# Patient Record
Sex: Female | Born: 1970 | Race: White | Hispanic: No | Marital: Married | Smoking: Never smoker
Health system: Southern US, Community
[De-identification: ages and names within clinical notes are randomized; demographics above are authoritative.]

## PROBLEM LIST (undated history)

## (undated) DIAGNOSIS — T7840XA Allergy, unspecified, initial encounter: Secondary | ICD-10-CM

## (undated) HISTORY — PX: VEIN LIGATION: SHX2652

## (undated) HISTORY — DX: Allergy, unspecified, initial encounter: T78.40XA

---

## 2010-11-05 ENCOUNTER — Emergency Department (HOSPITAL_COMMUNITY)
Admission: EM | Admit: 2010-11-05 | Discharge: 2010-11-05 | Payer: Self-pay | Source: Home / Self Care | Admitting: Family Medicine

## 2010-11-13 LAB — POCT I-STAT, CHEM 8
BUN: 9 mg/dL (ref 6–23)
Calcium, Ion: 1.12 mmol/L (ref 1.12–1.32)
Chloride: 106 mEq/L (ref 96–112)
Creatinine, Ser: 0.9 mg/dL (ref 0.4–1.2)
Glucose, Bld: 91 mg/dL (ref 70–99)
HCT: 46 % (ref 36.0–46.0)
Hemoglobin: 15.6 g/dL — ABNORMAL HIGH (ref 12.0–15.0)
Potassium: 4.3 mEq/L (ref 3.5–5.1)
Sodium: 139 mEq/L (ref 135–145)
TCO2: 28 mmol/L (ref 0–100)

## 2020-01-07 ENCOUNTER — Ambulatory Visit: Payer: Self-pay | Attending: Internal Medicine

## 2020-01-07 DIAGNOSIS — Z23 Encounter for immunization: Secondary | ICD-10-CM

## 2020-01-07 NOTE — Progress Notes (Signed)
   Covid-19 Vaccination Clinic  Name:  Carolyn Goodwin    MRN: [id number] DOB: 1971/02/09  01/07/2020  Carolyn Goodwin was observed post Covid-19 immunization for 15 minutes without incident. She was provided with Vaccine Information Sheet and instruction to access the V-Safe system.   Carolyn Goodwin was instructed to call 911 with any severe reactions post vaccine: Marland Kitchen Difficulty breathing  . Swelling of face and throat  . A fast heartbeat  . A bad rash all over body  . Dizziness and weakness   Immunizations Administered    Name Date Dose VIS Date Route   Moderna COVID-19 Vaccine 01/07/2020 11:06 AM 0.5 mL 09/29/2019 Intramuscular   Manufacturer: Moderna   Lot: [id number]   NDC: 53748-270-78

## 2020-02-09 ENCOUNTER — Ambulatory Visit: Payer: Self-pay | Attending: Family

## 2020-02-09 DIAGNOSIS — Z23 Encounter for immunization: Secondary | ICD-10-CM

## 2020-02-09 NOTE — Progress Notes (Signed)
   Covid-19 Vaccination Clinic  Name:  Carolyn Goodwin    MRN: [id number] DOB: [birth year]  02/09/2020  Carolyn Goodwin was observed post Covid-19 immunization for 30 minutes based on pre-vaccination screening without incident. She was provided with Vaccine Information Sheet and instruction to access the V-Safe system.   Carolyn Goodwin was instructed to call 911 with any severe reactions post vaccine: Marland Kitchen Difficulty breathing  . Swelling of face and throat  . A fast heartbeat  . A bad rash all over body  . Dizziness and weakness   Immunizations Administered    Name Date Dose VIS Date Route   Moderna COVID-19 Vaccine 02/09/2020  2:13 PM 0.5 mL 09/29/2019 Intramuscular   Manufacturer: Moderna   Lot: [id number]   NDC: 32009-417-91

## 2020-06-17 ENCOUNTER — Other Ambulatory Visit: Payer: Self-pay | Admitting: Obstetrics

## 2020-06-17 DIAGNOSIS — N63 Unspecified lump in unspecified breast: Secondary | ICD-10-CM

## 2020-07-06 ENCOUNTER — Other Ambulatory Visit: Payer: Self-pay | Admitting: Obstetrics

## 2020-07-06 ENCOUNTER — Ambulatory Visit
Admission: RE | Admit: 2020-07-06 | Discharge: 2020-07-06 | Disposition: A | Discharge status: Home / Self Care | Payer: BC Managed Care – PPO | Source: Ambulatory Visit | Attending: Obstetrics | Admitting: Obstetrics

## 2020-07-06 ENCOUNTER — Other Ambulatory Visit: Payer: Self-pay

## 2020-07-06 DIAGNOSIS — N6320 Unspecified lump in the left breast, unspecified quadrant: Secondary | ICD-10-CM

## 2020-07-06 DIAGNOSIS — N63 Unspecified lump in unspecified breast: Secondary | ICD-10-CM

## 2020-09-27 ENCOUNTER — Ambulatory Visit: Payer: BC Managed Care – PPO | Attending: Internal Medicine

## 2020-09-27 DIAGNOSIS — Z23 Encounter for immunization: Secondary | ICD-10-CM

## 2020-09-27 NOTE — Progress Notes (Signed)
   Covid-19 Vaccination Clinic  Name:  Carolyn Goodwin    MRN: [id number] DOB: Apr 20, 1971  09/27/2020  [name] was observed post Covid-19 immunization for 15 minutes without incident. She was provided with Vaccine Information Sheet and instruction to access the V-Safe system.   [name] was instructed to call 911 with any severe reactions post vaccine: Marland Kitchen Difficulty breathing  . Swelling of face and throat  . A fast heartbeat  . A bad rash all over body  . Dizziness and weakness   Immunizations Administered    No immunizations on file.

## 2021-01-04 ENCOUNTER — Other Ambulatory Visit: Payer: BC Managed Care – PPO

## 2021-01-16 ENCOUNTER — Ambulatory Visit
Admission: RE | Admit: 2021-01-16 | Discharge: 2021-01-16 | Disposition: A | Discharge status: Home / Self Care | Payer: BC Managed Care – PPO | Source: Ambulatory Visit | Attending: Obstetrics | Admitting: Obstetrics

## 2021-01-16 ENCOUNTER — Other Ambulatory Visit: Payer: Self-pay

## 2021-01-16 ENCOUNTER — Other Ambulatory Visit: Payer: Self-pay | Admitting: Obstetrics

## 2021-01-16 DIAGNOSIS — N6320 Unspecified lump in the left breast, unspecified quadrant: Secondary | ICD-10-CM

## 2021-07-14 ENCOUNTER — Other Ambulatory Visit: Payer: Self-pay

## 2021-07-14 ENCOUNTER — Ambulatory Visit
Admission: RE | Admit: 2021-07-14 | Discharge: 2021-07-14 | Disposition: A | Discharge status: Home / Self Care | Payer: BC Managed Care – PPO | Source: Ambulatory Visit | Attending: Obstetrics | Admitting: Obstetrics

## 2021-07-14 DIAGNOSIS — N6320 Unspecified lump in the left breast, unspecified quadrant: Secondary | ICD-10-CM

## 2021-07-21 ENCOUNTER — Other Ambulatory Visit: Payer: Self-pay

## 2021-09-08 ENCOUNTER — Ambulatory Visit: Payer: BC Managed Care – PPO | Attending: Family

## 2021-09-08 DIAGNOSIS — Z23 Encounter for immunization: Secondary | ICD-10-CM

## 2021-09-11 NOTE — Progress Notes (Signed)
   Covid-19 Vaccination Clinic  Name:  Venora Kautzman    MRN: [id number] DOB: June 17, 1971  09/11/2021  Ms. Dorce was observed post Covid-19 immunization for 15 minutes without incident. She was provided with Vaccine Information Sheet and instruction to access the V-Safe system.   Ms. Lodato was instructed to call 911 with any severe reactions post vaccine: Difficulty breathing  Swelling of face and throat  A fast heartbeat  A bad rash all over body  Dizziness and weakness

## 2022-09-19 ENCOUNTER — Encounter: Payer: Self-pay | Admitting: Obstetrics

## 2022-09-19 DIAGNOSIS — Z1231 Encounter for screening mammogram for malignant neoplasm of breast: Secondary | ICD-10-CM

## 2023-04-01 ENCOUNTER — Ambulatory Visit: Payer: BC Managed Care – PPO | Admitting: Family Medicine

## 2023-04-01 ENCOUNTER — Encounter: Payer: Self-pay | Admitting: Family Medicine

## 2023-04-01 VITALS — BP 138/88 | HR 86 | Temp 98.6°F | Resp 16 | Ht 61.0 in | Wt 114.4 lb

## 2023-04-01 DIAGNOSIS — Z1211 Encounter for screening for malignant neoplasm of colon: Secondary | ICD-10-CM | POA: Diagnosis not present

## 2023-04-01 DIAGNOSIS — N979 Female infertility, unspecified: Secondary | ICD-10-CM | POA: Diagnosis not present

## 2023-04-01 DIAGNOSIS — Z0000 Encounter for general adult medical examination without abnormal findings: Secondary | ICD-10-CM

## 2023-04-01 DIAGNOSIS — Z1159 Encounter for screening for other viral diseases: Secondary | ICD-10-CM | POA: Diagnosis not present

## 2023-04-01 DIAGNOSIS — Z1212 Encounter for screening for malignant neoplasm of rectum: Secondary | ICD-10-CM

## 2023-04-01 NOTE — Progress Notes (Signed)
New Patient Office Visit  Subjective:  Patient ID: Carolyn Goodwin, female    DOB: 1971-07-30  Age: 52 y.o. MRN: [id number]  CC:  Chief Complaint  Patient presents with   New Patient (Initial Visit)    Previously seen at [facility], has not been seen in several years    Infertility    Was being seen in a clinic that randomly closed Pregnant last Fall but miscarried    Insect Bite    Left thigh Water blister and red rash     HPI Carolyn Goodwin presents for new patient from [place].  From [place]  Infertility-clinic closed last year(s)  was pregnant in Fall and embryo quit developing.  [facility] [place].  Has to restart process.  Needs form signed that healthy.  Saw OB last week(s).  Talking w/[facility] fertility clinic in [place]. Getting mamm tomorrow.   Got labs for vitamin D, prolactin, DHEA.  Menses regular since march but shorter. Active-walks, gardens.  Trying to get pregnant since 2018.  Insect bite left thigh-"water blister"  red rash.  Was working in yard and felt stinging left leg.  Several spots-thigh.  Wasn't able to see culprit.  Pulled pants off.    Current Outpatient Medications:    Bromocriptine Mesylate (CYCLOSET) 0.8 MG TABS, Take 0.4 mg by mouth daily., Disp: , Rfl:   Past Medical History:  Diagnosis Date   Allergy     Past Surgical History:  Procedure Laterality Date   VEIN LIGATION Bilateral     Family History  Problem Relation Age of Onset   Hypertension Mother    Depression Mother    Dementia Mother    Asthma Father    Varicose Veins Father    Cancer Maternal Grandmother 68       panc   Breast cancer Other        53's great maternal aunt    Social History   Socioeconomic History   Marital status: Married    Spouse name: Not on file   Number of children: 0   Years of education: Not on file   Highest education level: Not on file  Occupational History   Occupation: professor    Comment: [employer]   Tobacco Use   Smoking status: Never   Smokeless tobacco: Never  Vaping Use   Vaping Use: Never used  Substance and Sexual Activity   Alcohol use: Not Currently   Drug use: Never   Sexual activity: Yes    Birth control/protection: None  Other Topics Concern   Not on file  Social History Narrative   Step-4   Social Determinants of Health   Financial Resource Strain: Not on file  Food Insecurity: Not on file  Transportation Needs: Not on file  Physical Activity: Not on file  Stress: Not on file  Social Connections: Not on file  Intimate Partner Violence: Not on file    ROS  ROS: Gen: no fever, chills  Skin: HPI ENT: no ear pain, ear drainage, nasal congestion, rhinorrhea, sinus pressure, sore throat.  Some allergies Eyes: no blurry vision, double vision Resp: no cough, wheeze,SOB CV: no CP, palpitations, LE edema,  uses herbs for circulation  GI: no heartburn, n/v/d/c, abd pain GU: no dysuria, urgency, frequency, hematuria MSK: no joint pain, myalgias, back pain Neuro: no dizziness, headache, weakness, vertigo Psych: no depression, anxiety, insomnia, SI   Objective:   Today's Vitals: BP 138/88   Pulse 86  Temp 98.6 F (37 C) (Temporal)   Resp 16   Ht 5\' 1"  (1.549 m)   Wt 114 lb 6.4 oz (51.9 kg)   SpO2 98%   BMI 21.62 kg/m   Physical Exam  Gen: WDWN NAD HEENT: NCAT, conjunctiva not injected, sclera nonicteric TM WNL B, OP moist, no exudates  NECK:  supple, no thyromegaly, no nodes, no carotid bruits CARDIAC: RRR, S1S2+, no murmur. DP 2+B LUNGS: CTAB. No wheezes ABDOMEN:  BS+, soft, NTND, No HSM, no masses EXT:  no edema MSK: no gross abnormalities.  NEURO: A&O x3.  CN II-XII intact.  PSYCH: normal mood. Good eye contact  Left thigh-near knee medial side-red area but scratched.  Later thigh-2 slisters(few mm) w/surrounding erythema.  EKG-NSR, no ST changes.  Normal tracing  11/20/22 TSH 1.68  Assessment & Plan:  Wellness examination -     EKG  12-Lead -     Lipid panel -     Comprehensive metabolic panel -     CBC with Differential/Platelet -     Hemoglobin A1c -     TSH  Screening for viral disease -     Hepatitis C antibody -     HIV Antibody (routine testing w rflx) -     Hepatitis B surface antigen -     Measles/Mumps/Rubella Immunity -     Hepatitis B core antibody, total  Infertility, female, primary -     EKG 12-Lead -     Hepatitis C antibody -     HIV Antibody (routine testing w rflx) -     Hepatitis B surface antigen -     Measles/Mumps/Rubella Immunity -     Hepatitis B core antibody, total  Screening for colorectal cancer -     Cologuard  Wellness-anticipatory guidance.  Work on Diet/Exercise  Check CBC,CMP,lipids,TSH, A1C.  F/u 1 yr   signed form that patient is healthy.  Pap UpToDate, getting mamm tomorrow.  Ordered cologard.   Infertility-seeing clinic in [place] and following w/OB/gynecology.  Check MMR titers, etc.   Bug bites left thigh-patient felt it happen.  Blister.  Improved per patient.  Topical treatment(s).  Monitor for now.  Told patient I have no idea what did this.    Follow-up: Return in about 1 year (around 03/31/2024) for annual physical.   Angelena Sole, MD

## 2023-04-01 NOTE — Patient Instructions (Signed)
Welcome to [facility] [place]! It was a pleasure meeting you today. ° °As discussed, Please schedule a 12 month follow up visit today. ° °PLEASE NOTE: ° °If you had any LAB tests please let us know if you have not heard back within a few days. You may see your results on MyChart before we have a chance to review them but we will give you a call once they are reviewed by us. If we ordered any REFERRALS today, please let us know if you have not heard from their office within the next week.  °Let us know through MyChart if you are needing REFILLS, or have your pharmacy send us the request. You can also use MyChart to communicate with me or any office staff. ° °Please try these tips to maintain a healthy lifestyle: ° °Eat most of your calories during the day when you are active. Eliminate processed foods including packaged sweets (pies, cakes, cookies), reduce intake of potatoes, white bread, white pasta, and white rice. Look for whole grain options, oat flour or almond flour. ° °Each meal should contain half fruits/vegetables, one quarter protein, and one quarter carbs (no bigger than a computer mouse). ° °Cut down on sweet beverages. This includes juice, soda, and sweet tea. Also watch fruit intake, though this is a healthier sweet option, it still contains natural sugar! Limit to 3 servings daily. ° °Drink at least 1 glass of water with each meal and aim for at least 8 glasses per day ° °Exercise at least 150 minutes every week.   °

## 2023-04-02 LAB — CBC WITH DIFFERENTIAL/PLATELET
Basophils Absolute: 0.2 10*3/uL — ABNORMAL HIGH (ref 0.0–0.1)
Basophils Relative: 1.7 % (ref 0.0–3.0)
Eosinophils Absolute: 0.3 10*3/uL (ref 0.0–0.7)
Eosinophils Relative: 2.8 % (ref 0.0–5.0)
HCT: 39 % (ref 36.0–46.0)
Hemoglobin: 12.9 g/dL (ref 12.0–15.0)
Lymphocytes Relative: 14.2 % (ref 12.0–46.0)
Lymphs Abs: 1.4 10*3/uL (ref 0.7–4.0)
MCHC: 33.1 g/dL (ref 30.0–36.0)
MCV: 97.3 fl (ref 78.0–100.0)
Monocytes Absolute: 0.7 10*3/uL (ref 0.1–1.0)
Monocytes Relative: 7.4 % (ref 3.0–12.0)
Neutro Abs: 7 10*3/uL (ref 1.4–7.7)
Neutrophils Relative %: 73.9 % (ref 43.0–77.0)
Platelets: 441 10*3/uL — ABNORMAL HIGH (ref 150.0–400.0)
RBC: 4 Mil/uL (ref 3.87–5.11)
RDW: 13.2 % (ref 11.5–15.5)
WBC: 9.5 10*3/uL (ref 4.0–10.5)

## 2023-04-02 LAB — LIPID PANEL
Cholesterol: 233 mg/dL — ABNORMAL HIGH (ref 0–200)
HDL: 77.6 mg/dL (ref >39.00)
LDL Cholesterol: 130 mg/dL — ABNORMAL HIGH (ref 0–99)
NonHDL: 155.79
Total CHOL/HDL Ratio: 3
Triglycerides: 130 mg/dL (ref 0.0–149.0)
VLDL: 26 mg/dL (ref 0.0–40.0)

## 2023-04-02 LAB — COMPREHENSIVE METABOLIC PANEL
ALT: 14 U/L (ref 0–35)
AST: 16 U/L (ref 0–37)
Albumin: 4.6 g/dL (ref 3.5–5.2)
Alkaline Phosphatase: 51 U/L (ref 39–117)
BUN: 13 mg/dL (ref 6–23)
CO2: 32 mEq/L (ref 19–32)
Calcium: 9.6 mg/dL (ref 8.4–10.5)
Chloride: 101 mEq/L (ref 96–112)
Creatinine, Ser: 0.76 mg/dL (ref 0.40–1.20)
GFR: 91.31 mL/min (ref >60.00)
Glucose, Bld: 80 mg/dL (ref 70–99)
Potassium: 3.8 mEq/L (ref 3.5–5.1)
Sodium: 140 mEq/L (ref 135–145)
Total Bilirubin: 0.3 mg/dL (ref 0.2–1.2)
Total Protein: 7.2 g/dL (ref 6.0–8.3)

## 2023-04-02 LAB — HEPATITIS B SURFACE ANTIGEN: Hepatitis B Surface Ag: NONREACTIVE

## 2023-04-02 LAB — HEMOGLOBIN A1C: Hgb A1c MFr Bld: 5.2 % (ref 4.6–6.5)

## 2023-04-02 LAB — MEASLES/MUMPS/RUBELLA IMMUNITY
Mumps IgG: 78.3 AU/mL
Rubella: 21.3 Index
Rubeola IgG: 175 AU/mL

## 2023-04-02 LAB — HEPATITIS B CORE ANTIBODY, TOTAL: Hep B Core Total Ab: NONREACTIVE

## 2023-04-02 LAB — HEPATITIS C ANTIBODY: Hepatitis C Ab: NONREACTIVE

## 2023-04-02 LAB — TSH: TSH: 1.15 u[IU]/mL (ref 0.35–5.50)

## 2023-04-02 LAB — HIV ANTIBODY (ROUTINE TESTING W REFLEX): HIV 1&2 Ab, 4th Generation: NONREACTIVE

## 2023-04-02 NOTE — Progress Notes (Signed)
Labs look good.  Immune to measles, mumps, rubella

## 2023-04-11 ENCOUNTER — Encounter: Payer: Self-pay | Admitting: Family Medicine

## 2023-04-15 LAB — COLOGUARD: COLOGUARD: NEGATIVE

## 2023-09-10 IMAGING — MG DIGITAL DIAGNOSTIC BILAT W/ TOMO W/ CAD
8 series · 8 of 24 positions shown · non-contrast
Comparison: Previous exam(s).

CLINICAL DATA: 49-year-old female for 1 year follow-up of LEFT
breast mass and for annual bilateral mammogram.

EXAM:
DIGITAL DIAGNOSTIC BILATERAL MAMMOGRAM WITH TOMOSYNTHESIS AND CAD;
ULTRASOUND LEFT BREAST LIMITED
TECHNIQUE: Bilateral digital diagnostic mammography and breast tomosynthesis
was performed. The images were evaluated with computer-aided
detection.; Targeted ultrasound examination of the left breast was
performed.

[R MLO synth-2D]
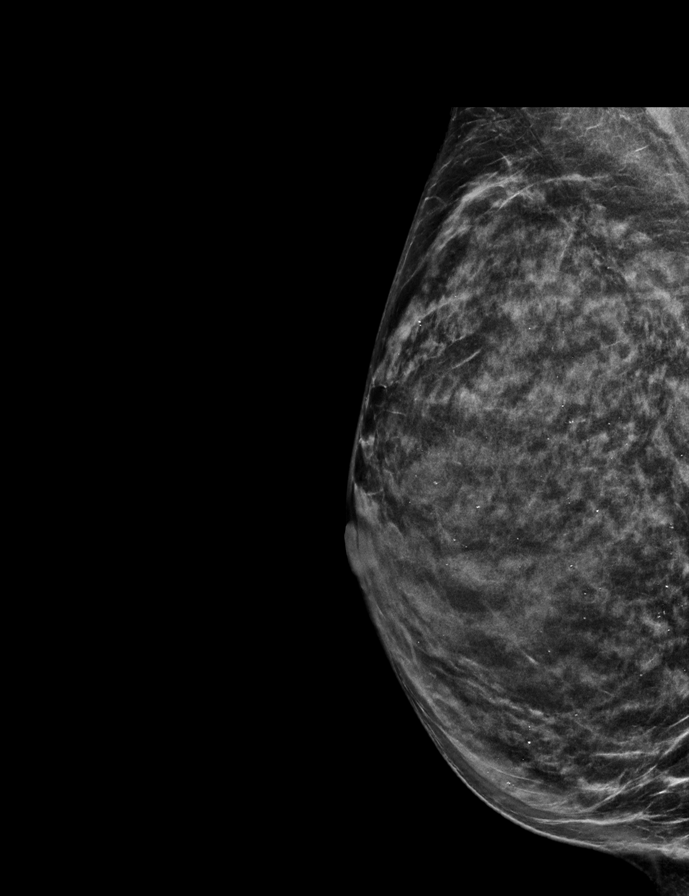

[L CC synth-2D]
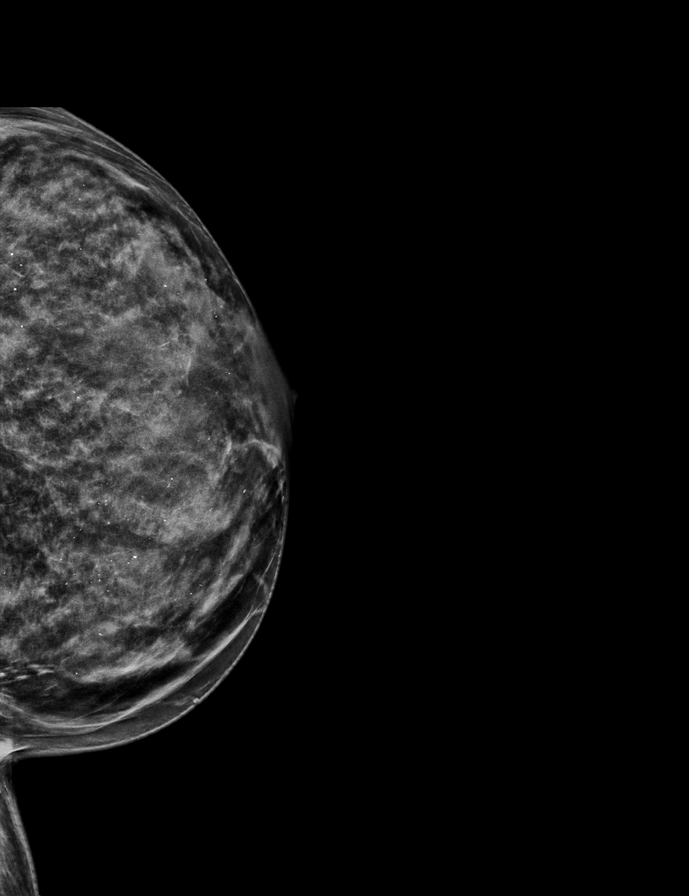

[R CC synth-2D]
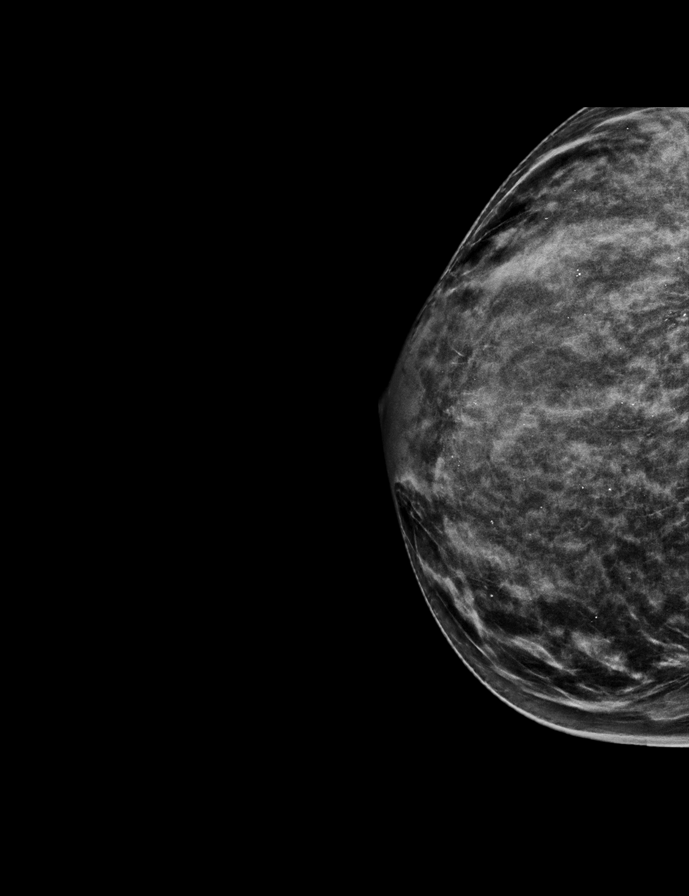

[L MLO synth-2D]
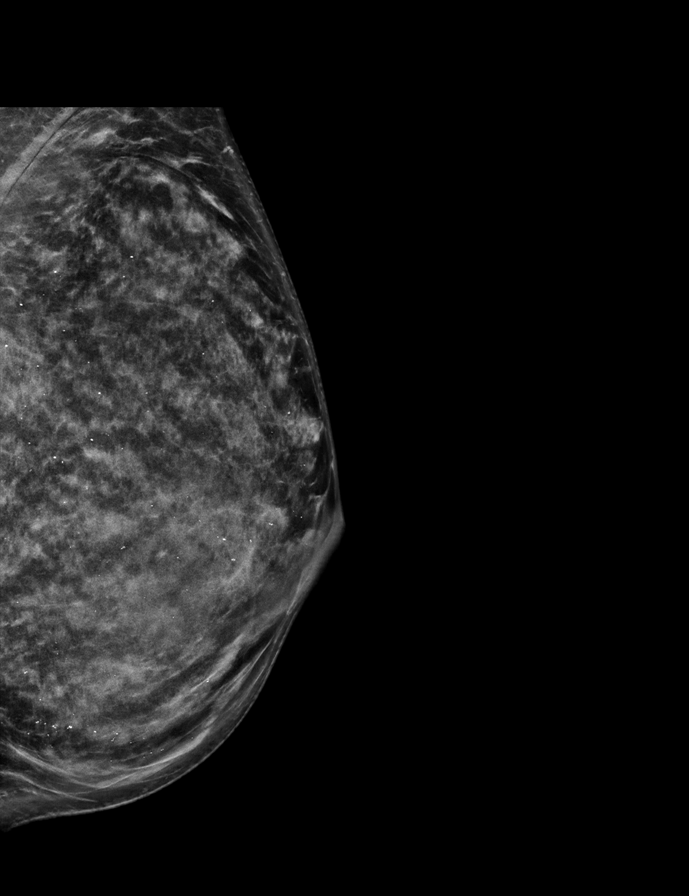

[L CC tomo · tomo slice 27/54.0]
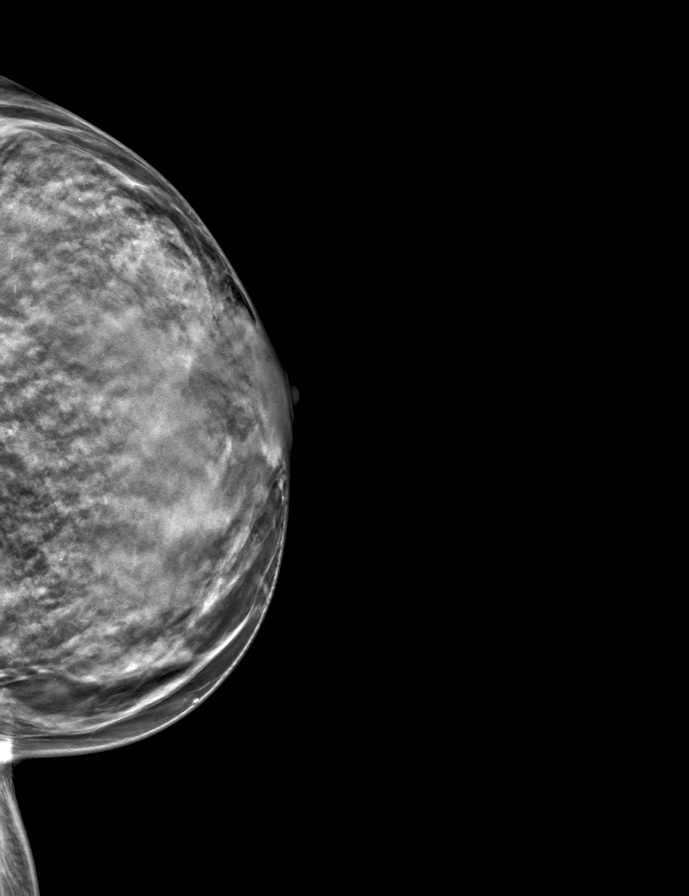

[R MLO tomo · tomo slice 29/57.0]
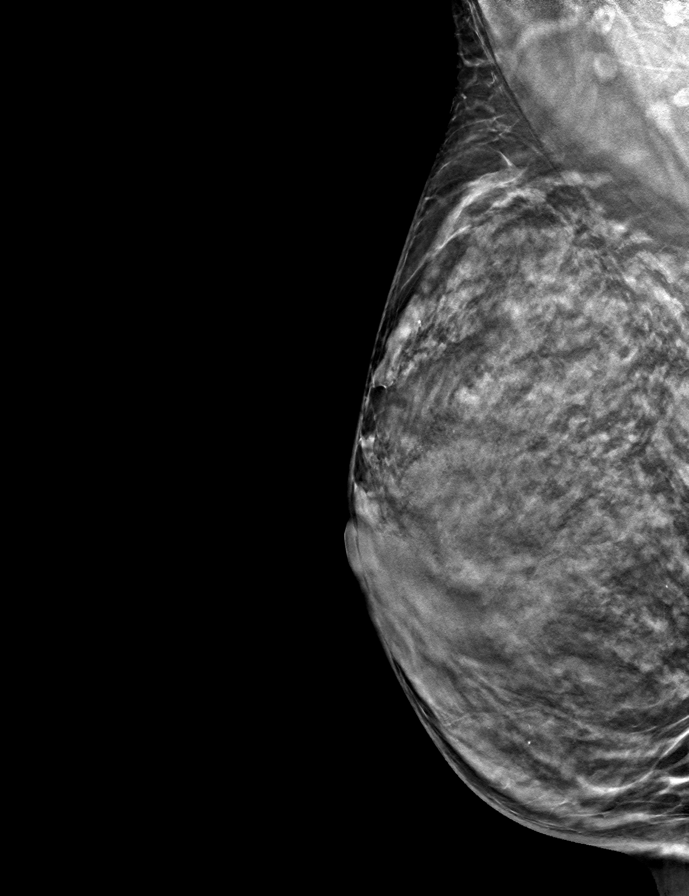

[R CC tomo · tomo slice 27/53.0]
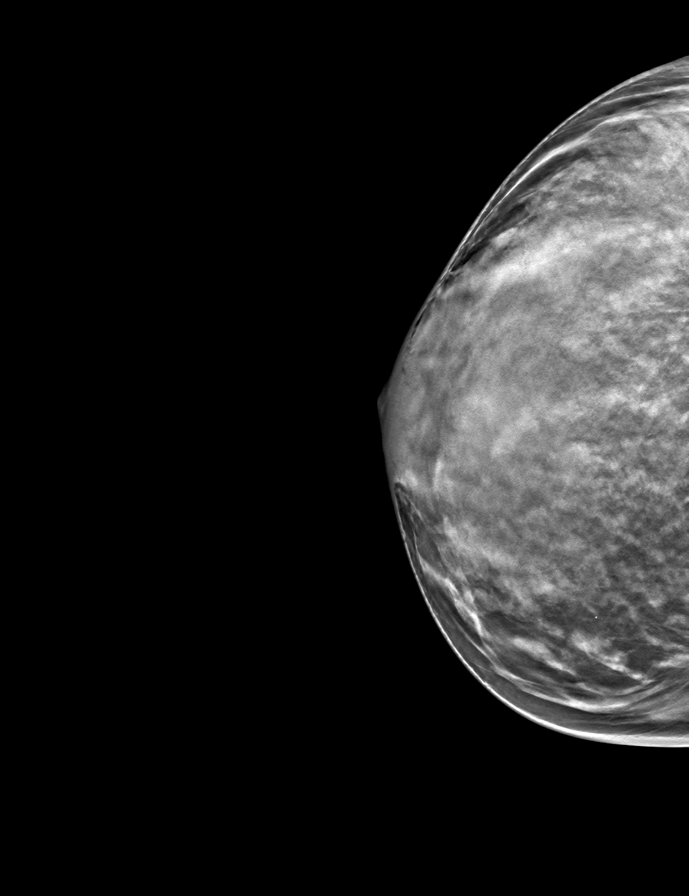

[L MLO tomo · tomo slice 33/64.0]
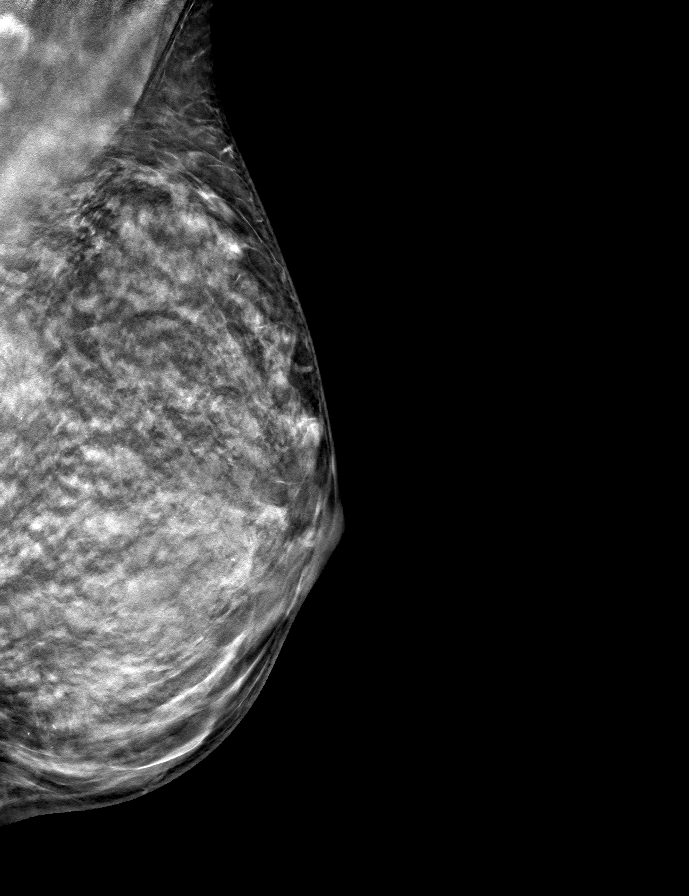

[8 of 24 positions shown; findings below may reference images not displayed]

ACR Breast Density Category d: The breast tissue is extremely dense,
which lowers the sensitivity of mammography.
FINDINGS: 2D/3D full field views of both breasts demonstrate no suspicious
mass, distortion or worrisome calcifications.

Targeted ultrasound is performed, showing a 0.4 x 0.3 x 0.6 cm
circumscribed oval hypoechoic parallel mass at the 6 o'clock
position of the LEFT breast 3 cm from the nipple, previously
measuring 0.6 x 0.4 x 0.9 cm on 07/06/2020.
IMPRESSION: 1. Decreased size of LOWER LEFT breast mass, compatible with a
benign mass, likely complicated cyst. No further imaging follow-up
recommended.
2. No mammographic evidence of breast malignancy.

RECOMMENDATION:
Bilateral screening mammogram in 1 year.

I have discussed the findings and recommendations with the patient.
If applicable, a reminder letter will be sent to the patient
regarding the next appointment.

BI-RADS CATEGORY  2: Benign.

## 2024-04-21 DIAGNOSIS — O365930 Maternal care for other known or suspected poor fetal growth, third trimester, not applicable or unspecified: Secondary | ICD-10-CM | POA: Insufficient documentation

## 2024-04-26 DIAGNOSIS — Z98891 History of uterine scar from previous surgery: Secondary | ICD-10-CM | POA: Insufficient documentation

## 2024-04-26 DIAGNOSIS — O133 Gestational [pregnancy-induced] hypertension without significant proteinuria, third trimester: Secondary | ICD-10-CM | POA: Insufficient documentation

## 2024-04-26 DIAGNOSIS — D62 Acute posthemorrhagic anemia: Secondary | ICD-10-CM | POA: Insufficient documentation

## 2024-04-27 DIAGNOSIS — O09523 Supervision of elderly multigravida, third trimester: Secondary | ICD-10-CM | POA: Insufficient documentation

## 2024-06-11 ENCOUNTER — Ambulatory Visit: Payer: Self-pay

## 2024-06-11 NOTE — Telephone Encounter (Signed)
 FYI Only or Action Required?: FYI only for provider.  Patient was last seen in primary care on 04/01/2023 by Wendolyn Jenkins Jansky, MD.  Called Nurse Triage reporting Foot Pain.  Symptoms began after recent childbirth, but is worse over the last couple of days.  Interventions attempted: OTC medications: Ibuprofen.  Symptoms are: gradually worsening.  Triage Disposition: See Physician Within 24 Hours  Patient/caregiver understands and will follow disposition?: Yes         Copied from CRM [id number]. Topic: Clinical - Red Word Triage >> Jun 11, 2024  9:42 AM Macario HERO wrote: Red Word that prompted transfer to Nurse Triage: Patient stated under her feet has been hurting for awhile and it's only getting worse since she had her baby. Right feet under heel pain/swollen.        Reason for Disposition  [1] Swollen foot AND [2] no fever  (Exceptions: Localized bump from bunions, calluses, insect bite, sting.)  Answer Assessment - Initial Assessment Questions 1. ONSET: When did the pain start?      1-2 months, worse over the last couple of days 2. LOCATION: Where is the pain located?      Right foot under heel 3. PAIN: How bad is the pain?    (Scale 1-10; or mild, moderate, severe)     Mild  4. WORK OR EXERCISE: Has there been any recent work or exercise that involved this part of the body?      No 5. CAUSE: What do you think is causing the foot pain?     Unsure 6. OTHER SYMPTOMS: Do you have any other symptoms? (e.g., leg pain, rash, fever, numbness)     Swelling of right heel, numbness under heel  Protocols used: Foot Pain-A-AH

## 2024-06-11 NOTE — Telephone Encounter (Signed)
 Appointment scheduled for 06/12/24.

## 2024-06-12 ENCOUNTER — Ambulatory Visit (INDEPENDENT_AMBULATORY_CARE_PROVIDER_SITE_OTHER): Payer: Self-pay | Admitting: Family Medicine

## 2024-06-12 ENCOUNTER — Encounter: Payer: Self-pay | Admitting: Family Medicine

## 2024-06-12 VITALS — BP 135/87 | HR 105 | Temp 98.0°F | Resp 16 | Ht 61.0 in | Wt 119.1 lb

## 2024-06-12 DIAGNOSIS — M79672 Pain in left foot: Secondary | ICD-10-CM

## 2024-06-12 DIAGNOSIS — M549 Dorsalgia, unspecified: Secondary | ICD-10-CM | POA: Diagnosis not present

## 2024-06-12 DIAGNOSIS — M79671 Pain in right foot: Secondary | ICD-10-CM | POA: Diagnosis not present

## 2024-06-12 DIAGNOSIS — N61 Mastitis without abscess: Secondary | ICD-10-CM

## 2024-06-12 MED ORDER — DICLOXACILLIN SODIUM 500 MG PO CAPS: 500.0000 mg | ORAL_CAPSULE | Freq: Four times a day (QID) | ORAL | 0 refills | Status: DC

## 2024-06-12 MED ORDER — PREDNISONE 20 MG PO TABS: 40.0000 mg | ORAL_TABLET | Freq: Every day | ORAL | 0 refills | Status: AC
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
## 2024-06-12 NOTE — Patient Instructions (Signed)
[facility]  [address] on the 1st floor Phone number [phone]  

## 2024-06-17 ENCOUNTER — Ambulatory Visit: Payer: Self-pay

## 2024-06-17 NOTE — Telephone Encounter (Signed)
 FYI Only or Action Required?: Action required by provider: request for appointment. Need to speak with PCP, no pain relief, finished steroid medication.  Patient was last seen in primary care on 06/12/2024 by Wendolyn Jenkins Jansky, MD.  Called Nurse Triage reporting Pain.  Symptoms began several days ago.  Interventions attempted: Prescription medications: steroid medication and Ice/heat application.  Symptoms are: unchanged.  Triage Disposition: See PCP Within 2 Weeks  Patient/caregiver understands and will follow disposition?: Yes       Copied from CRM #[id number]. Topic: Clinical - Red Word Triage >> Jun 17, 2024 11:14 AM Drema MATSU wrote: Red Word that prompted transfer to Nurse Triage: Patient stated that her symptoms are getting worse. She is going to complete prednisone  today and hasn't completed antibiotics yet. Symptoms: pain in breast and inflammation of back, feet (heel) and pain in right knee. Reason for Disposition  [1] Breast pain or tenderness AND [2] occurs monthly before menstrual period AND [3] has NOT been evaluated by a doctor (or NP/PA)  Answer Assessment - Initial Assessment Questions Have appointments scheduled with pcp 9/2 and sports medicine 9/8, but patient states too far away, having pain. Steroids was not effective. Antibiotic not finished, 5 more days.  Back, both heels, knees, generalized pain/inflammation. Masitits-taking abt, finish steroids,  Lump left breast, hard not going away Right breast, stinging lumps   1. SYMPTOM: What's the main symptom you're concerned about?  (e.g., lump, nipple discharge, pain, rash)     Pain, not red, hard with lumps; 6/27 delivered baby,  2. LOCATION: Where is the both breast located?     Both breast, generalized pain 3. ONSET: When did ?  start?    Generalized pain towards end of pregnancy, breasts problems after baby born 4. PRIOR HISTORY: Do you have any history of prior problems with your breasts? (e.g.,  breast cancer, breast implant, fibrocystic breast disease)     No; had hx lumps breast and mammograms 5. CAUSE: What do you think is causing this symptom?     unsure 6. OTHER SYMPTOMS: Do you have any other symptoms? (e.g., breast pain, fever, nipple discharge, redness or rash)     No fever, discharge, rash, no redness 7. PREGNANCY-BREASTFEEDING: Is there any chance you are pregnant? When was your last menstrual period? Are you breastfeeding?     Currently breastfeeding  Protocols used: Breast Symptoms-A-AH

## 2024-06-17 NOTE — Telephone Encounter (Signed)
 Pt scheduled to see Dr. Wendolyn on 06/22/24.

## 2024-06-18 ENCOUNTER — Telehealth: Payer: Self-pay | Admitting: Family Medicine

## 2024-06-18 ENCOUNTER — Other Ambulatory Visit: Payer: Self-pay | Admitting: Family Medicine

## 2024-06-18 DIAGNOSIS — N63 Unspecified lump in unspecified breast: Secondary | ICD-10-CM

## 2024-06-18 MED ORDER — MELOXICAM 15 MG PO TABS: 15.0000 mg | ORAL_TABLET | Freq: Every day | ORAL | 0 refills | Status: DC

## 2024-06-18 NOTE — Telephone Encounter (Signed)
 I called and spoke with [name] at [facility] and she stated they needed the images from [facility] not just the report that's in Care Everywhere. I called [facility] and spoke with [name] who will be sending the report over to [facility] so they can get her scheduled tomorrow morning. They closed early today. They will be contacting her directly to schedule the appointment.     Copied from CRM #[id number]. Topic: General - Other >> Jun 18, 2024  2:51 PM Carolyn Goodwin wrote: Reason for CRM: Patient is calling because she is unable to schedule her mammography without previous results. She is very concerns and would like to know If we can forward all previous results to the imaging department.

## 2024-06-18 NOTE — Telephone Encounter (Signed)
 Orders placed. Sent to stat chat.

## 2024-06-18 NOTE — Telephone Encounter (Signed)
  Provider aware, this is being handled in a separate encounter.   Copied from CRM #[id number]. Topic: Clinical - Red Word Triage >> Jun 17, 2024 11:14 AM Drema MATSU wrote: Red Word that prompted transfer to Nurse Triage: Patient stated that her symptoms are getting worse. She is going to complete prednisone  today and hasn't completed antibiotics yet. Symptoms: pain in breast and inflammation of back, feet (heel) and pain in right knee. >> Jun 18, 2024 11:27 AM Chiquita SQUIBB wrote: Patient is calling in stating she has not heard back regarding the pain or medication, please advise patient.

## 2024-06-18 NOTE — Telephone Encounter (Signed)
 Please review patient message and advise   Copied from CRM #[id number]. Topic: Clinical - Request for Lab/Test Order >> Jun 18, 2024 11:28 AM Carolyn Goodwin wrote: Reason for CRM: Patient stated she saw a lactation consult yesterday and they stated she should get a ultrasound of her left breast due to a possible abscess

## 2024-06-18 NOTE — Telephone Encounter (Signed)
 Called ans spoke with patient. Made her aware we have ordered stat breast ultrasounds and called in Meloxicam  for her. She verbalized understanding and no further questions at this time.    Copied from CRM [id number]. Topic: General - Other >> Jun 18, 2024  2:13 PM Jasmin G wrote: Reason for CRM: Pt called regarding her mastitis, please refer to recent Encounters, I relayed all the info left by staff and offered to connect her to NT but she declined as she stated that was not what she needed, I told pt I would send a High Priority message for her to get a status as she is in pain, please call her back ASAP at [phone] to update her on the status of everything.

## 2024-06-18 NOTE — Telephone Encounter (Signed)
 Meloxicam  sent in for patient.

## 2024-06-19 ENCOUNTER — Other Ambulatory Visit: Payer: Self-pay | Admitting: Family Medicine

## 2024-06-19 ENCOUNTER — Encounter: Payer: Self-pay | Admitting: *Deleted

## 2024-06-19 ENCOUNTER — Ambulatory Visit
Admission: RE | Admit: 2024-06-19 | Discharge: 2024-06-19 | Disposition: A | Discharge status: Home / Self Care | Source: Ambulatory Visit | Attending: Family Medicine | Admitting: Family Medicine

## 2024-06-19 ENCOUNTER — Telehealth: Payer: Self-pay | Admitting: *Deleted

## 2024-06-19 ENCOUNTER — Ambulatory Visit

## 2024-06-19 ENCOUNTER — Other Ambulatory Visit (HOSPITAL_COMMUNITY)
Admission: RE | Admit: 2024-06-19 | Discharge: 2024-06-19 | Disposition: A | Discharge status: Home / Self Care | Source: Other Acute Inpatient Hospital | Attending: Diagnostic Radiology | Admitting: Diagnostic Radiology

## 2024-06-19 DIAGNOSIS — N63 Unspecified lump in unspecified breast: Secondary | ICD-10-CM | POA: Diagnosis present

## 2024-06-19 NOTE — Telephone Encounter (Signed)
 Copied from CRM [id number]. Topic: Clinical - Medication Question >> Jun 19, 2024  9:05 AM Laymon HERO wrote: Reason for CRM: Patient wanting to know if it is okay to take the meloxicam  (MOBIC ) 15 MG tablet while she is breastfeeding, please reach out as soon as possible

## 2024-06-19 NOTE — Telephone Encounter (Signed)
 Left message to return call

## 2024-06-19 NOTE — Telephone Encounter (Signed)
 Patient notified of message below.

## 2024-06-21 ENCOUNTER — Ambulatory Visit: Payer: Self-pay | Admitting: Family Medicine

## 2024-06-21 ENCOUNTER — Other Ambulatory Visit: Payer: Self-pay | Admitting: Family Medicine

## 2024-06-21 MED ORDER — CLINDAMYCIN HCL 300 MG PO CAPS: 300.0000 mg | ORAL_CAPSULE | Freq: Four times a day (QID) | ORAL | 0 refills | Status: DC

## 2024-06-21 NOTE — Progress Notes (Signed)
 LMAM about breast aspiration report.  Change keflex to clinda-sent to pharm.  Will d/w pt at appt on 8/25

## 2024-06-22 ENCOUNTER — Encounter: Payer: Self-pay | Admitting: Family Medicine

## 2024-06-22 ENCOUNTER — Ambulatory Visit: Payer: Self-pay | Admitting: Family Medicine

## 2024-06-22 ENCOUNTER — Ambulatory Visit: Admitting: Family Medicine

## 2024-06-22 ENCOUNTER — Ambulatory Visit (INDEPENDENT_AMBULATORY_CARE_PROVIDER_SITE_OTHER)

## 2024-06-22 VITALS — BP 122/62 | HR 89 | Temp 97.5°F | Resp 16 | Ht 61.0 in | Wt 122.1 lb

## 2024-06-22 DIAGNOSIS — N63 Unspecified lump in unspecified breast: Secondary | ICD-10-CM

## 2024-06-22 DIAGNOSIS — N61 Mastitis without abscess: Secondary | ICD-10-CM | POA: Diagnosis not present

## 2024-06-22 DIAGNOSIS — M546 Pain in thoracic spine: Secondary | ICD-10-CM

## 2024-06-22 LAB — TSH: TSH: 1.62 (ref 0.41–5.90)

## 2024-06-22 LAB — VITAMIN B12: Vitamin B-12: 1716

## 2024-06-22 NOTE — Patient Instructions (Signed)
 [facility] [address]. [place], [place] [phone]

## 2024-06-22 NOTE — Progress Notes (Signed)
 Thoracic spine normal

## 2024-06-22 NOTE — Progress Notes (Signed)
 Subjective:     Patient ID: Carolyn Goodwin, female    DOB: [birth year], 53 y.o.   MRN: [id number]  Chief Complaint  Patient presents with   Follow-up    Follow-up from last office visit    HPI Discussed the use of AI scribe software for clinical note transcription with the patient, who gave verbal consent to proceed.  History of Present Illness Carolyn Goodwin is a 53 year old female who presents with concerns about antibiotic resistance and persistent symptoms related to a breast abscess.  She has been dealing with a breast abscess, initially treated with dicloxacillin , which did not improve symptoms. She then had mamm, breast u/s w/aspiration of abscess in L breast.  She was then switched to Keflex, but cultures indicated potential resistance to that as well. She started clindamycin  300 mg yesterday and has taken two doses yesterday and 2 today so far. Redness and warmth in her breast initially improved with a steroid, but symptoms returned after stopping the medication. The breast remained warm over the weekend, and she still feels lumps despite some drainage performed on the left breast. No fever has been experienced. Overall some better, but large lump still there  A mammogram and ultrasound were performed, revealing an obscured mass with overlying skin thickening in the left breast. The ultrasound confirmed the presence of an abscess, measuring approximately 4.2 cm, with no lymph node involvement. She has a history of dense breasts and has previously experienced nodules, which have been benign. There is a family history of breast cancer in her great maternal aunt, but not in her mother or grandmother.  She also reports upper back pain, described as shooting pain that worsens with activity, such as carrying her baby. She has a history of a C-section with spinal anesthesia. She has been taking ibuprofen for pain management but stopped meloxicam  due to concerns  about breastfeeding safety. She is unsure if ibuprofen is effective and is concerned about the impact of her activities on her back pain. No fever.    Health Maintenance Due  Topic Date Due   Hepatitis B Vaccines 19-59 Average Risk (1 of 3 - 19+ 3-dose series) Never done   Cervical Cancer Screening (HPV/Pap Cotest)  Never done   INFLUENZA VACCINE  05/29/2024    Past Medical History:  Diagnosis Date   Allergy     Past Surgical History:  Procedure Laterality Date   CESAREAN SECTION  04/24/2024   VEIN LIGATION Bilateral      Current Outpatient Medications:    clindamycin  (CLEOCIN ) 300 MG capsule, Take 1 capsule (300 mg total) by mouth 4 (four) times daily for 10 days., Disp: 40 capsule, Rfl: 0   ibuprofen (ADVIL) 800 MG tablet, Take 800 mg by mouth., Disp: , Rfl:   Allergies  Allergen Reactions   Benzoyl Peroxide Rash and Shortness Of Breath   Other Hives and Rash   ROS neg/noncontributory except as noted HPI/below      Objective:     BP 122/62   Pulse 89   Temp (!) 97.5 F (36.4 C) (Temporal)   Resp 16   Ht 5' 1 (1.549 m)   Wt 122 lb 2 oz (55.4 kg)   SpO2 99%   Breastfeeding Yes   BMI 23.08 kg/m  Wt Readings from Last 3 Encounters:  06/22/24 122 lb 2 oz (55.4 kg)  06/12/24 119 lb 2 oz (54 kg)  04/01/23 114 lb 6.4 oz (51.9 kg)  Physical Exam Chest:        Gen: WDWN NAD HEENT: NCAT, conjunctiva not injected, sclera nonicteric EXT:  no edema some broken veins medial foot w/some tenderness to palp.  MSK: no gross abnormalities.  NEURO: A&O x3.  CN II-XII intact.  PSYCH: normal mood. Good eye contact Back-no redness.  No TTP spine.  Some TTP paraspinous area L near shoulder blade.  Redness R breast has resolved, but firm lump still present.  L breast-redness gone.  Still w/approx 4cm, mobile, nt mass     Assessment & Plan:  Acute midline thoracic back pain -     DG Thoracic Spine 2 View; Future  Lump in female breast -     Ambulatory referral  to General Surgery  Mastitis  Assessment and Plan Assessment & Plan Left breast abscess   Persistent left breast abscess with previous ineffective treatment using dicloxacillin  and Keflex.  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 . She is currently on clindamycin  300 mg, started yesterday, with partial drainage from IR and decreased redness indicating improvement. No fever is reported. Mammogram and ultrasound show an obscured mass with overlying skin thickening and a 4.2 cm abscess, with no lymph node involvement. Continue clindamycin  300 mg as prescribed. Follow up with ultrasound in 7 days to assess the abscess. Refer to a breast surgeon for further evaluation of the lump. Advise monitoring for fever; if fever develops, go to the emergency room for potential IV antibiotics.  Labs not ordered d/t confounding WBC from steroids. Clinically improving  Upper back pain   Chronic upper back pain with shooting pain . Pain persists and may worsen with physical activity. Differential includes neuropathy or musculoskeletal issues. Previous prednisone  treatment was ineffective. No fever or worsening symptoms suggest spinal abscess. Order an x-ray of the spine to rule out structural abnormalities. Continue ibuprofen and Tylenol for pain management, with caution regarding breastfeeding. Advise avoiding strenuous exercise until further evaluation by Doctor Leonce. Refer to physical therapy for evaluation and management.  Possible peripheral neuropathy   Possible peripheral neuropathy in the feet with numbness and tingling. A neurologist suggested neuropathy and recommended further testing. Differential includes nerve damage or circulatory issues. Proceed with a nerve conduction study (EMG) as referred by the neurologist. Continue with physical therapy referral for further evaluation and management.    Return in about 3 weeks (around 07/13/2024) for canc the spt 2 visit.  Jenkins CHRISTELLA Carrel, MD

## 2024-06-23 ENCOUNTER — Encounter: Payer: Self-pay | Admitting: Family Medicine

## 2024-06-24 ENCOUNTER — Other Ambulatory Visit: Payer: Self-pay | Admitting: *Deleted

## 2024-06-24 DIAGNOSIS — R7989 Other specified abnormal findings of blood chemistry: Secondary | ICD-10-CM

## 2024-06-24 LAB — AEROBIC/ANAEROBIC CULTURE W GRAM STAIN (SURGICAL/DEEP WOUND)

## 2024-06-25 ENCOUNTER — Encounter: Payer: Self-pay | Admitting: Family Medicine

## 2024-06-25 ENCOUNTER — Telehealth: Payer: Self-pay | Admitting: *Deleted

## 2024-06-25 NOTE — Progress Notes (Signed)
 FOLATE: >20.0 T3: 92

## 2024-06-25 NOTE — Telephone Encounter (Signed)
 Copied from CRM #[id number]. Topic: Clinical - Medical Advice >> Jun 25, 2024 11:22 AM Martinique E wrote: Reason for CRM: Patient would like to know if her lab appointment on 8/29 requires fasting. Callback number [phone].  Patient notified that she does not have to fast.

## 2024-06-26 ENCOUNTER — Other Ambulatory Visit: Payer: Self-pay | Admitting: *Deleted

## 2024-06-26 ENCOUNTER — Telehealth: Payer: Self-pay | Admitting: Family Medicine

## 2024-06-26 ENCOUNTER — Ambulatory Visit: Payer: Self-pay | Admitting: Family Medicine

## 2024-06-26 ENCOUNTER — Other Ambulatory Visit (INDEPENDENT_AMBULATORY_CARE_PROVIDER_SITE_OTHER)

## 2024-06-26 ENCOUNTER — Ambulatory Visit
Admission: RE | Admit: 2024-06-26 | Discharge: 2024-06-26 | Disposition: A | Discharge status: Home / Self Care | Source: Ambulatory Visit | Attending: Family Medicine | Admitting: Family Medicine

## 2024-06-26 ENCOUNTER — Other Ambulatory Visit: Payer: Self-pay | Admitting: Family Medicine

## 2024-06-26 DIAGNOSIS — R7989 Other specified abnormal findings of blood chemistry: Secondary | ICD-10-CM | POA: Diagnosis not present

## 2024-06-26 DIAGNOSIS — N63 Unspecified lump in unspecified breast: Secondary | ICD-10-CM

## 2024-06-26 DIAGNOSIS — N611 Abscess of the breast and nipple: Secondary | ICD-10-CM

## 2024-06-26 LAB — CBC WITH DIFFERENTIAL/PLATELET
Basophils Absolute: 0.1 K/uL (ref 0.0–0.1)
Basophils Relative: 1 % (ref 0.0–3.0)
Eosinophils Absolute: 0.3 K/uL (ref 0.0–0.7)
Eosinophils Relative: 3.2 % (ref 0.0–5.0)
HCT: 34.4 % — ABNORMAL LOW (ref 36.0–46.0)
Hemoglobin: 11.3 g/dL — ABNORMAL LOW (ref 12.0–15.0)
Lymphocytes Relative: 12.8 % (ref 12.0–46.0)
Lymphs Abs: 1.4 K/uL (ref 0.7–4.0)
MCHC: 33 g/dL (ref 30.0–36.0)
MCV: 95.1 fl (ref 78.0–100.0)
Monocytes Absolute: 0.6 K/uL (ref 0.1–1.0)
Monocytes Relative: 5.6 % (ref 3.0–12.0)
Neutro Abs: 8.3 K/uL — ABNORMAL HIGH (ref 1.4–7.7)
Neutrophils Relative %: 77.4 % — ABNORMAL HIGH (ref 43.0–77.0)
Platelets: 488 K/uL — ABNORMAL HIGH (ref 150.0–400.0)
RBC: 3.62 Mil/uL — ABNORMAL LOW (ref 3.87–5.11)
RDW: 13.9 % (ref 11.5–15.5)
WBC: 10.8 K/uL — ABNORMAL HIGH (ref 4.0–10.5)

## 2024-06-26 LAB — COMPREHENSIVE METABOLIC PANEL WITH GFR
ALT: 20 U/L (ref 0–35)
AST: 14 U/L (ref 0–37)
Albumin: 3.8 g/dL (ref 3.5–5.2)
Alkaline Phosphatase: 72 U/L (ref 39–117)
BUN: 15 mg/dL (ref 6–23)
CO2: 26 meq/L (ref 19–32)
Calcium: 8.8 mg/dL (ref 8.4–10.5)
Chloride: 103 meq/L (ref 96–112)
Creatinine, Ser: 0.76 mg/dL (ref 0.40–1.20)
GFR: 90.52 mL/min (ref >60.00)
Glucose, Bld: 116 mg/dL — ABNORMAL HIGH (ref 70–99)
Potassium: 3.9 meq/L (ref 3.5–5.1)
Sodium: 138 meq/L (ref 135–145)
Total Bilirubin: 0.3 mg/dL (ref 0.2–1.2)
Total Protein: 6.5 g/dL (ref 6.0–8.3)

## 2024-06-26 LAB — SEDIMENTATION RATE: Sed Rate: 29 mm/h (ref 0–30)

## 2024-06-26 MED ORDER — CLINDAMYCIN HCL 300 MG PO CAPS: 300.0000 mg | ORAL_CAPSULE | Freq: Four times a day (QID) | ORAL | 0 refills | Status: AC

## 2024-06-26 NOTE — Progress Notes (Signed)
 She is a bit anemic and wbc a little high(probably from the steroids she took and the infection).  Will discuss at appt in Sept

## 2024-06-26 NOTE — Telephone Encounter (Signed)
 Prescription Request  06/26/2024  LOV: 06/22/2024  What is the name of the medication or equipment? clindamycin  (CLEOCIN ) 300 MG capsule   Have you contacted your pharmacy to request a refill? No   Which pharmacy would you like this sent to?  [facility] [id number] [place], [place] - [address] [place] [address] Phone: [phone] Fax: [phone]    Patient notified that their request is being sent to the clinical staff for review and that they should receive a response within 2 business days.   Please advise at Mobile [phone] (mobile)

## 2024-06-26 NOTE — Progress Notes (Signed)
 Pt aware.  Sent in clinda for days

## 2024-06-26 NOTE — Telephone Encounter (Signed)
 Rx sent to the pharmacy. Patient stated that after her appointment this morning with the breast place, they told her it was tissues that were infected, not really much to drain and for right now no surgery. She has an appointment next Friday with the breast center to follow-up.

## 2024-06-30 ENCOUNTER — Ambulatory Visit: Payer: Self-pay | Admitting: Family Medicine

## 2024-07-03 ENCOUNTER — Ambulatory Visit
Admission: RE | Admit: 2024-07-03 | Discharge: 2024-07-03 | Disposition: A | Discharge status: Home / Self Care | Source: Ambulatory Visit | Attending: Family Medicine | Admitting: Family Medicine

## 2024-07-03 DIAGNOSIS — N63 Unspecified lump in unspecified breast: Secondary | ICD-10-CM

## 2024-07-03 DIAGNOSIS — N611 Abscess of the breast and nipple: Secondary | ICD-10-CM

## 2024-07-03 NOTE — Progress Notes (Signed)
 Carolyn Goodwin [facility] [address] [place] [address] Phone: [phone]   Assessment and Plan:     1. Neck pain (Primary) 2. Chronic bilateral thoracic back pain 3. Chronic bilateral low back pain, unspecified whether sciatica present - Chronic with exacerbation, initial visit - Multiple ongoing musculoskeletal complaints including neck pain, upper back pain, low back pain, numbness and tingling in bilateral heels, numbness and tingling in left thumb that started and third trimester pregnancy, but have continued 2+ months postpartum.  Most consistent with musculoskeletal dysfunction from pregnancy, muscular soreness and fatigue from childcare.  Numbness and tingling symptoms into left thumb and bilateral heels could be from a central cervical spine/lumbar spine pathology or peripheral nerve pathology - Patient was referred to neurology with the thought of possibly ordering EMGs.  Patient has no muscular weakness, so I do not feel that EMG is urgent.  Could be considered if symptoms do not improve - Will obtain x-rays at today's visit and further review at follow-up visit - Patient is currently breast-feeding, so do not recommend prolonged prescription NSAID course, muscle relaxers, prednisone  course - Use Tylenol 500 to 1000 mg tablets 2-3 times a day for day-to-day pain relief -Start HEP and physical therapy for neck, upper back, lower back, ankles  15 additional minutes spent for educating Therapeutic Home Exercise Program.  This included exercises focusing on stretching, strengthening, with focus on eccentric aspects.   Long term goals include an improvement in range of motion, strength, endurance as well as avoiding reinjury. Patient's frequency would include in 1-2 times a day, 3-5 times a week for a duration of 6-12 weeks. Proper technique shown and discussed handout in great detail with ATC.  All questions were discussed and answered.       Pertinent previous records reviewed include thoracic spine x-ray 06/22/2024   Follow Up: 4 weeks for reevaluation.  Could further discuss OMT versus advanced imaging    Subjective:   I, Carolyn Goodwin, am serving as a Neurosurgeon for Doctor Morene Mace  Chief Complaint: back pain  HPI:   07/06/2024 Patient is a 53 year old female with back pain. Patient states upper back pain started at the end of the pregnancy. Pain has calmed down a bit but she has numbness down her body. 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 Pain Is constant and she thinks nerves may be compromised. She would like to know the cause. Massages have helped. She starte PT on Friday    Relevant Historical Information: Postpartum with delivery in June 2025  Additional pertinent review of systems negative.   Current Outpatient Medications:    clindamycin  (CLEOCIN ) 300 MG capsule, Take 1 capsule (300 mg total) by mouth 4 (four) times daily for 10 days., Disp: 40 capsule, Rfl: 0   ibuprofen (ADVIL) 800 MG tablet, Take 800 mg by mouth., Disp: , Rfl:    Objective:     Vitals:   07/06/24 1544  Pulse: (!) 102  SpO2: 98%  Weight: 122 lb (55.3 kg)  Height: 5' 1 (1.549 m)      Body mass index is 23.05 kg/m.    Physical Exam:    Gen: Appears well, nad, nontoxic and pleasant Psych: Alert and oriented, appropriate mood and affect Neuro: Decreased sensation to left thumb, bilateral heels.  Otherwise, sensation intact, strength is 5/5 in upper and lower extremities, muscle tone wnl Skin: no susupicious lesions or rashes  Back -  Normal skin, Spine with normal alignment and no deformity.     tenderness to thoracic spine vertebral process palpation.   Bilateral thoracic, L4-L5 paraspinous muscles are   tender and without spasm NTTP gluteal musculature Straight leg raise negative, though reproduced pain in thoracic spine Trendelenberg negative Piriformis Test negative Gait normal  Pain in thoracic  spine with lumbar extension  Electronically signed by:  Odis Mace [facility] 4:25 PM 07/06/24

## 2024-07-06 ENCOUNTER — Ambulatory Visit

## 2024-07-06 ENCOUNTER — Ambulatory Visit: Payer: Self-pay | Admitting: Sports Medicine

## 2024-07-06 VITALS — HR 102 | Ht 61.0 in | Wt 122.0 lb

## 2024-07-06 DIAGNOSIS — R200 Anesthesia of skin: Secondary | ICD-10-CM

## 2024-07-06 DIAGNOSIS — M545 Low back pain, unspecified: Secondary | ICD-10-CM | POA: Diagnosis not present

## 2024-07-06 DIAGNOSIS — M546 Pain in thoracic spine: Secondary | ICD-10-CM

## 2024-07-06 DIAGNOSIS — M542 Cervicalgia: Secondary | ICD-10-CM

## 2024-07-06 DIAGNOSIS — G8929 Other chronic pain: Secondary | ICD-10-CM

## 2024-07-06 DIAGNOSIS — R202 Paresthesia of skin: Secondary | ICD-10-CM | POA: Diagnosis not present

## 2024-07-06 NOTE — Patient Instructions (Addendum)
 Xrays on the way out   Tylenol [phone] mg 2-3 times a day for pain relief   May use topical medications over areas of pain   Neck HEP   4 week follow up

## 2024-07-07 ENCOUNTER — Ambulatory Visit (INDEPENDENT_AMBULATORY_CARE_PROVIDER_SITE_OTHER): Admitting: Family Medicine

## 2024-07-07 ENCOUNTER — Encounter: Payer: Self-pay | Admitting: Family Medicine

## 2024-07-07 VITALS — BP 142/80 | HR 86 | Temp 98.0°F | Resp 16 | Ht 61.0 in | Wt 121.0 lb

## 2024-07-07 DIAGNOSIS — N63 Unspecified lump in unspecified breast: Secondary | ICD-10-CM | POA: Diagnosis not present

## 2024-07-07 DIAGNOSIS — R030 Elevated blood-pressure reading, without diagnosis of hypertension: Secondary | ICD-10-CM
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
## 2024-07-07 NOTE — Patient Instructions (Signed)
 It was very nice to see you today!  Check blood pressure daily for 1 week then at least once/week for 1 month.  Let me know if stays elevated.    PLEASE NOTE:  If you had any lab tests please let us  know if you have not heard back within a few days. You may see your results on MyChart before we have a chance to review them but we will give you a call once they are reviewed by us . If we ordered any referrals today, please let us  know if you have not heard from their office within the next week.   Please try these tips to maintain a healthy lifestyle:  Eat most of your calories during the day when you are active. Eliminate processed foods including packaged sweets (pies, cakes, cookies), reduce intake of potatoes, white bread, white pasta, and white rice. Look for whole grain options, oat flour or almond flour.  Each meal should contain half fruits/vegetables, one quarter protein, and one quarter carbs (no bigger than a computer mouse).  Cut down on sweet beverages. This includes juice, soda, and sweet tea. Also watch fruit intake, though this is a healthier sweet option, it still contains natural sugar! Limit to 3 servings daily.  Drink at least 1 glass of water with each meal and aim for at least 8 glasses per day  Exercise at least 150 minutes every week.

## 2024-07-08 ENCOUNTER — Other Ambulatory Visit: Payer: Self-pay | Admitting: *Deleted

## 2024-07-08 DIAGNOSIS — R7989 Other specified abnormal findings of blood chemistry: Secondary | ICD-10-CM
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
## 2024-07-10 ENCOUNTER — Ambulatory Visit: Admitting: Physical Therapy

## 2024-07-10 ENCOUNTER — Encounter: Payer: Self-pay | Admitting: Physical Therapy

## 2024-07-10 ENCOUNTER — Other Ambulatory Visit: Payer: Self-pay

## 2024-07-10 DIAGNOSIS — M79672 Pain in left foot: Secondary | ICD-10-CM

## 2024-07-10 DIAGNOSIS — M79671 Pain in right foot: Secondary | ICD-10-CM

## 2024-07-10 DIAGNOSIS — M5459 Other low back pain: Secondary | ICD-10-CM | POA: Diagnosis not present

## 2024-07-10 DIAGNOSIS — M6281 Muscle weakness (generalized): Secondary | ICD-10-CM

## 2024-07-10 DIAGNOSIS — M546 Pain in thoracic spine: Secondary | ICD-10-CM

## 2024-07-10 NOTE — Patient Instructions (Signed)
 Access Code: [email or web address] URL: [email or web address] Date: 07/10/2024 Prepared by: Elaine Daring  Exercises - Supine Pelvic Tilt  - 1 x daily - 10 reps - 5 seconds hold - Supine Piriformis Stretch with Foot on Ground  - 1 x daily - 3 reps - 30 seconds hold - Supine Cervical Retraction with Towel  - 1 x daily - 10 reps - 5 seconds hold - Cat Cow  - 1 x daily - 10 reps

## 2024-07-13 ENCOUNTER — Encounter: Payer: Self-pay | Admitting: Physical Therapy

## 2024-07-13 ENCOUNTER — Other Ambulatory Visit: Payer: Self-pay

## 2024-07-13 ENCOUNTER — Ambulatory Visit: Admitting: Physical Therapy

## 2024-07-13 DIAGNOSIS — M79672 Pain in left foot: Secondary | ICD-10-CM | POA: Diagnosis not present

## 2024-07-13 DIAGNOSIS — M546 Pain in thoracic spine: Secondary | ICD-10-CM | POA: Diagnosis not present

## 2024-07-13 DIAGNOSIS — M79671 Pain in right foot: Secondary | ICD-10-CM

## 2024-07-13 DIAGNOSIS — M5459 Other low back pain: Secondary | ICD-10-CM

## 2024-07-13 DIAGNOSIS — M6281 Muscle weakness (generalized): Secondary | ICD-10-CM
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
## 2024-07-13 NOTE — Patient Instructions (Signed)
 Access Code: [email or web address] URL: [email or web address] Date: 07/13/2024 Prepared by: Elaine Daring  Exercises - Supine Pelvic Tilt  - 1 x daily - 10 reps - 5 seconds hold - Supine Cervical Retraction with Towel  - 1 x daily - 10 reps - 5 seconds hold - Cat Cow  - 1 x daily - 10 reps - Seated Piriformis Stretch  - 1 x daily - 3 reps - 30 seconds hold - Thoracic Extension Mobilization on Foam Roll  - 1 x daily - 10 reps

## 2024-07-14 ENCOUNTER — Ambulatory Visit: Payer: Self-pay | Admitting: Sports Medicine

## 2024-07-15 ENCOUNTER — Other Ambulatory Visit (INDEPENDENT_AMBULATORY_CARE_PROVIDER_SITE_OTHER)

## 2024-07-15 ENCOUNTER — Other Ambulatory Visit: Payer: Self-pay | Admitting: *Deleted

## 2024-07-15 DIAGNOSIS — R7989 Other specified abnormal findings of blood chemistry: Secondary | ICD-10-CM | POA: Diagnosis not present

## 2024-07-15 DIAGNOSIS — E611 Iron deficiency: Secondary | ICD-10-CM | POA: Diagnosis not present

## 2024-07-15 LAB — CBC WITH DIFFERENTIAL/PLATELET
Basophils Absolute: 0.1 K/uL (ref 0.0–0.1)
Basophils Relative: 1 % (ref 0.0–3.0)
Eosinophils Absolute: 0.3 K/uL (ref 0.0–0.7)
Eosinophils Relative: 3.6 % (ref 0.0–5.0)
HCT: 38.1 % (ref 36.0–46.0)
Hemoglobin: 12.5 g/dL (ref 12.0–15.0)
Lymphocytes Relative: 18.7 % (ref 12.0–46.0)
Lymphs Abs: 1.3 K/uL (ref 0.7–4.0)
MCHC: 32.7 g/dL (ref 30.0–36.0)
MCV: 95 fl (ref 78.0–100.0)
Monocytes Absolute: 0.5 K/uL (ref 0.1–1.0)
Monocytes Relative: 6.9 % (ref 3.0–12.0)
Neutro Abs: 4.8 K/uL (ref 1.4–7.7)
Neutrophils Relative %: 69.8 % (ref 43.0–77.0)
Platelets: 404 K/uL — ABNORMAL HIGH (ref 150.0–400.0)
RBC: 4.01 Mil/uL (ref 3.87–5.11)
RDW: 14.5 % (ref 11.5–15.5)
WBC: 6.9 K/uL (ref 4.0–10.5)

## 2024-07-15 LAB — B12 AND FOLATE PANEL
Folate: >22.9 ng/mL (ref >5.9)
Vitamin B-12: 809 pg/mL (ref 211–911)

## 2024-07-16 ENCOUNTER — Ambulatory Visit: Payer: Self-pay | Admitting: Family Medicine

## 2024-07-16 LAB — IRON,TIBC AND FERRITIN PANEL
%SAT: 33 % (ref 16–45)
Ferritin: 25 ng/mL (ref 16–232)
Iron: 123 ug/dL (ref 45–160)
TIBC: 371 ug/dL (ref 250–450)

## 2024-07-16 NOTE — Progress Notes (Signed)
 Labs better.  Can do prenatal vitamin if wants since breastfeeding.  The B12 will be fine

## 2024-07-20 ENCOUNTER — Other Ambulatory Visit: Payer: Self-pay

## 2024-07-20 ENCOUNTER — Encounter: Payer: Self-pay | Admitting: Physical Therapy

## 2024-07-20 ENCOUNTER — Ambulatory Visit: Admitting: Physical Therapy

## 2024-07-20 DIAGNOSIS — M6281 Muscle weakness (generalized): Secondary | ICD-10-CM

## 2024-07-20 DIAGNOSIS — M79671 Pain in right foot: Secondary | ICD-10-CM | POA: Diagnosis not present

## 2024-07-20 DIAGNOSIS — M546 Pain in thoracic spine: Secondary | ICD-10-CM

## 2024-07-20 DIAGNOSIS — M79672 Pain in left foot: Secondary | ICD-10-CM | POA: Diagnosis not present

## 2024-07-20 DIAGNOSIS — M5459 Other low back pain: Secondary | ICD-10-CM | POA: Diagnosis not present
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
## 2024-07-20 NOTE — Patient Instructions (Signed)
 Access Code: [email or web address] URL: [email or web address] Date: 07/20/2024 Prepared by: Elaine Daring  Exercises - Supine Pelvic Tilt  - 1 x daily - 10 reps - 5 seconds hold - Supine Cervical Retraction with Towel  - 1 x daily - 10 reps - 5 seconds hold - Cat Cow  - 1 x daily - 10 reps - Seated Piriformis Stretch  - 1 x daily - 3 reps - 30 seconds hold - Seated Thoracic Flexion and Rotation with Arms Crossed  - 2-3 x daily - 10 reps

## 2024-07-23 ENCOUNTER — Encounter: Admitting: Physical Therapy

## 2024-07-27 ENCOUNTER — Encounter: Payer: Self-pay | Admitting: Physical Therapy

## 2024-07-27 ENCOUNTER — Ambulatory Visit: Admitting: Physical Therapy

## 2024-07-27 ENCOUNTER — Other Ambulatory Visit: Payer: Self-pay

## 2024-07-27 DIAGNOSIS — M79671 Pain in right foot: Secondary | ICD-10-CM

## 2024-07-27 DIAGNOSIS — M6281 Muscle weakness (generalized): Secondary | ICD-10-CM

## 2024-07-27 DIAGNOSIS — M546 Pain in thoracic spine: Secondary | ICD-10-CM | POA: Diagnosis not present

## 2024-07-27 DIAGNOSIS — M5459 Other low back pain: Secondary | ICD-10-CM

## 2024-07-27 DIAGNOSIS — M79672 Pain in left foot: Secondary | ICD-10-CM

## 2024-07-27 NOTE — Patient Instructions (Signed)
 Access Code: [email or web address] URL: [email or web address] Date: 07/27/2024 Prepared by: Elaine Daring  Exercises - Supine Pelvic Tilt  - 1 x daily - 10 reps - 5 seconds hold - Supine Cervical Retraction with Towel  - 1 x daily - 10 reps - 5 seconds hold - Cat Cow  - 1 x daily - 10 reps - Seated Thoracic Flexion and Rotation with Arms Crossed  - 2-3 x daily - 10 reps - Seated Piriformis Stretch with Trunk Bend  - 1 x daily - 3 reps - 20 seconds hold - Supine Sciatic Nerve Glide  - 1 x daily - 3 sets - 10 reps
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
## 2024-07-31 ENCOUNTER — Encounter: Payer: Self-pay | Admitting: Family

## 2024-07-31 ENCOUNTER — Ambulatory Visit: Payer: Self-pay

## 2024-07-31 ENCOUNTER — Ambulatory Visit: Admitting: Family

## 2024-07-31 VITALS — BP 128/76 | HR 105 | Temp 99.0°F | Ht 61.0 in | Wt 118.0 lb

## 2024-07-31 DIAGNOSIS — N61 Mastitis without abscess: Secondary | ICD-10-CM | POA: Diagnosis not present

## 2024-07-31 MED ORDER — CLINDAMYCIN HCL 300 MG PO CAPS: 300.0000 mg | ORAL_CAPSULE | Freq: Four times a day (QID) | ORAL | 0 refills | Status: DC

## 2024-07-31 NOTE — Progress Notes (Signed)
 Acute Office Visit  Subjective:     Patient ID: Carolyn Goodwin, female    DOB: [birth year], 53 y.o.   MRN: [id number]  Chief Complaint  Patient presents with   Medical Management of Chronic Issues    Breast lump on right breast says it went away for 2 days then came back     HPI Patient is in today for with complaints of pain, redness to her right breast that began 4 days ago, appeared to be improving after cool compresses but has since returned and is more red and painful.  She is in nursing.  She has a history of mastitis of the left breast about 2 weeks ago.  She has been massaging the right breast attempting to resolve the issue that has not helped.  She is also seeing a lactation specialist.  Pain 4 out of 10.  Review of Systems  Respiratory: Negative.    Cardiovascular: Negative.   Skin:        Right breast pain and tenderness.   Psychiatric/Behavioral: Negative.    All other systems reviewed and are negative.  Past Medical History:  Diagnosis Date   Allergy     Social History   Socioeconomic History   Marital status: Married    Spouse name: Not on file   Number of children: 1   Years of education: Not on file   Highest education level: Not on file  Occupational History   Occupation: professor    Comment: [employer]  Tobacco Use   Smoking status: Never   Smokeless tobacco: Never  Vaping Use   Vaping status: Never Used  Substance and Sexual Activity   Alcohol use: Not Currently   Drug use: Never   Sexual activity: Yes    Birth control/protection: None  Other Topics Concern   Not on file  Social History Narrative   Step-4,   son   Social Drivers of Corporate investment banker Strain: Low Risk  (05/06/2024)   Received from [facility]   Overall Financial Resource Strain (CARDIA)    Difficulty of Paying Living Expenses: Not hard at all  Food Insecurity: No Food Insecurity (04/21/2024)   Received from [facility]   Hunger Vital  Sign    Within the past 12 months, you worried that your food would run out before you got the money to buy more.: Never true    Within the past 12 months, the food you bought just didn't last and you didn't have money to get more.: Never true  Transportation Needs: No Transportation Needs (04/21/2024)   Received from [facility] - Transportation    Lack of Transportation (Medical): No    Lack of Transportation (Non-Medical): No  Physical Activity: Not on file  Stress: No Stress Concern Present (05/06/2024)   Received from [facility] - Occupational Stress Questionnaire    Feeling of Stress : Not at all  Social Connections: Unknown (05/06/2024)   Received from [facility]   Social Connection and Isolation Panel    In a typical week, how many times do you talk on the phone with family, friends, or neighbors?: Three times a week    How often do you get together with friends or relatives?: Once a week    Attends Religious Services: Not on file    Active Member of Clubs or Organizations: Not on file    Attends Banker  Meetings: Not on file    Marital Status: Not on file  Intimate Partner Violence: Not At Risk (04/26/2024)   Received from [facility]   HITS    Over the last 12 months how often did your partner physically hurt you?: Never    Over the last 12 months how often did your partner insult you or talk down to you?: Never    Over the last 12 months how often did your partner threaten you with physical harm?: Never    Over the last 12 months how often did your partner scream or curse at you?: Never    Past Surgical History:  Procedure Laterality Date   CESAREAN SECTION  04/24/2024   VEIN LIGATION Bilateral     Family History  Problem Relation Age of Onset   Hypertension Mother    Depression Mother    Dementia Mother    Asthma Father    Varicose Veins Father    Cancer Maternal Grandmother 63       panc    Breast cancer Other        50's great maternal aunt    Allergies  Allergen Reactions   Benzoyl Peroxide Rash and Shortness Of Breath   Other Hives, Rash and Dermatitis    Current Outpatient Medications on File Prior to Visit  Medication Sig Dispense Refill   ibuprofen (ADVIL) 800 MG tablet Take 800 mg by mouth. (Patient not taking: Reported on 07/31/2024)     No current facility-administered medications on file prior to visit.    BP 128/76 (BP Location: Right Arm, Patient Position: Sitting, Cuff Size: Normal)   Pulse (!) 105   Temp 99 F (37.2 C) (Oral)   Ht 5' 1 (1.549 m)   Wt 118 lb (53.5 kg)   SpO2 98%   Breastfeeding Yes   BMI 22.30 kg/m chart      Objective:    BP 128/76 (BP Location: Right Arm, Patient Position: Sitting, Cuff Size: Normal)   Pulse (!) 105   Temp 99 F (37.2 C) (Oral)   Ht 5' 1 (1.549 m)   Wt 118 lb (53.5 kg)   SpO2 98%   Breastfeeding Yes   BMI 22.30 kg/m    Physical Exam Vitals and nursing note reviewed.  Constitutional:      Appearance: Normal appearance. She is normal weight.  Cardiovascular:     Rate and Rhythm: Normal rate and regular rhythm.  Pulmonary:     Effort: Pulmonary effort is normal.     Breath sounds: Normal breath sounds.  Chest:  Breasts:    Right: Swelling and tenderness present.       Comments: Warm to touch Skin:    General: Skin is warm and dry.  Neurological:     General: No focal deficit present.     Mental Status: She is alert and oriented to person, place, and time. Mental status is at baseline.  Psychiatric:        Mood and Affect: Mood normal.        Behavior: Behavior normal.        Thought Content: Thought content normal.     No results found for any visits on 07/31/24.      Assessment & Plan:   Problem List Items Addressed This Visit   None Visit Diagnoses       Mastitis, right, acute    -  Primary       Meds ordered this encounter  Medications  clindamycin  (CLEOCIN ) 300 MG  capsule    Sig: Take 1 capsule (300 mg total) by mouth 4 (four) times daily.    Dispense:  28 capsule    Refill:  0   Call the office if symptoms worsen or persist.  Recheck as scheduled and sooner as needed. No follow-ups on file.  Seamus Warehime B Nolin Grell, FNP

## 2024-07-31 NOTE — Telephone Encounter (Signed)
Pt seen in office today by another provider.

## 2024-07-31 NOTE — Telephone Encounter (Signed)
 FYI Only or Action Required?: FYI only for provider.  Patient was last seen in primary care on 07/07/2024 by Carolyn Jenkins Jansky, MD.  Called Nurse Triage reporting Breast Problem.  Symptoms began several days ago.  Interventions attempted: Rest, hydration, or home remedies, Ice/heat application, and Other: massage, increased nursing frequency.  Symptoms are: gradually worsening.  Triage Disposition: See Physician Within 24 Hours  Patient/caregiver understands and will follow disposition?: Yes   Copied from CRM #[id number]. Topic: Clinical - Red Word Triage >> Jul 31, 2024  9:42 AM Maisie BROCKS wrote: Red Word that prompted transfer to Nurse Triage: had a mastitis and abscess on the left but was treated for it, but now its starting on right breast and is swollen and painful. Reason for Disposition  [1] Breast looks infected (spreading redness, feels hot or painful to touch) AND [2] no fever  Answer Assessment - Initial Assessment Questions Additional info: Patient recently experienced mastitis to left breast which resolved, now is having similar symptoms in right breast. She has tried ice, heat, massage, increase nursing but not resolving.  Acute visit scheduled at alternate regional office, patient aware of practice location and provider name.   1. SYMPTOM: What's the main symptom you're concerned about?  (e.g., lump, nipple discharge, pain, rash)     Right breast feels clogged  2. LOCATION: Where is the clog  located?     right 3. ONSET: When did symptoms  start?     Monday 4. PRIOR HISTORY: Do you have any history of prior problems with your breasts? (e.g., breast cancer, breast implant, fibrocystic breast disease)     Yes, mastitis in left breast  5. CAUSE: What do you think is causing this symptom?     Mastitis, clogged milk duct 6. OTHER SYMPTOMS: Do you have any other symptoms? (e.g., breast pain, fever, nipple discharge, redness or rash)     Hard lump, warm to touch 7.  PREGNANCY-BREASTFEEDING: Is there any chance you are pregnant? When was your last menstrual period? Are you breastfeeding?     She is breastfeeding.  Protocols used: Breast Symptoms-A-AH

## 2024-08-03 ENCOUNTER — Encounter: Payer: Self-pay | Admitting: Physical Therapy

## 2024-08-03 ENCOUNTER — Ambulatory Visit: Admitting: Physical Therapy

## 2024-08-03 ENCOUNTER — Other Ambulatory Visit: Payer: Self-pay

## 2024-08-03 DIAGNOSIS — M79672 Pain in left foot: Secondary | ICD-10-CM | POA: Diagnosis not present

## 2024-08-03 DIAGNOSIS — M79671 Pain in right foot: Secondary | ICD-10-CM

## 2024-08-03 DIAGNOSIS — M5459 Other low back pain: Secondary | ICD-10-CM | POA: Diagnosis not present

## 2024-08-03 DIAGNOSIS — M546 Pain in thoracic spine: Secondary | ICD-10-CM

## 2024-08-03 DIAGNOSIS — M6281 Muscle weakness (generalized): Secondary | ICD-10-CM
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
## 2024-08-04 NOTE — Progress Notes (Unsigned)
 Ben Jackson D.[facility]MENTEEN [facility] [address] [place] [address] Phone: [phone]   Assessment and Plan:     1. Neck pain (Primary) 2. Chronic bilateral thoracic back pain 3. Chronic bilateral low back pain with bilateral sciatica 4. Numbness and tingling of lower extremity 5. Numbness and tingling of left upper extremity -Chronic with exacerbation, subsequent visit - Continued multiple areas of musculoskeletal pain including neck, upper back, lower back, numbness and tingling into bilateral heels, numbness and tingling into left thumb that started during third trimester pregnancy, but have continued 3 months postpartum.  Concern for central cause of radiculopathy - Reviewed x-rays at today's visit which showed mild degenerative changes, but were otherwise unremarkable - Recommend further evaluation with MRI of C-spine and lumbar spine due to failure to improve despite >6 weeks of conservative therapy, pain with day-to-day activities, pain at times >6/10. - Patient was referred to neurology with the thought of ordering EMGs.  Patient has no muscular weakness, so I do not feel that EMG is urgent.  Could consider EMG if no clear explanation is found on MRI imaging - Patient is currently breast-feeding, so recommend avoiding p.o. NSAIDs, prednisone , muscle relaxers - Use Tylenol 500 to 1000 mg tablets 2-3 times a day for day-to-day pain relief - Continue HEP and physical therapy for neck, upper back, lower back, ankles  Pertinent previous records reviewed include x-ray C-spine, lumbar spine, right foot, left foot 07/06/2024   Follow Up: 5 days after MRI to review results and discuss treatment plan.  Could consider epidural CSI versus further discussing OMT versus neurology referral/EMG   Subjective:   I, Chestine Reeves, am serving as a Neurosurgeon for Doctor Morene Mace   Chief Complaint: back pain   HPI:    07/06/2024 Patient is a 53 year old  female with back pain. Patient states upper back pain started at the end of the pregnancy. Pain has calmed down a bit but she has numbness down her body. 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 Pain Is constant and she thinks nerves may be compromised. She would like to know the cause. Massages have helped. She starts PT on Friday    08/05/2024 Patient states she still has pain   Relevant Historical Information: Postpartum with delivery in June 2025  Additional pertinent review of systems negative.   Current Outpatient Medications:    clindamycin  (CLEOCIN ) 300 MG capsule, Take 1 capsule (300 mg total) by mouth 4 (four) times daily., Disp: 28 capsule, Rfl: 0   ibuprofen (ADVIL) 800 MG tablet, Take 800 mg by mouth. (Patient not taking: Reported on 07/31/2024), Disp: , Rfl:    Objective:     Vitals:   08/05/24 1002  Pulse: 94  SpO2: 99%  Weight: 118 lb (53.5 kg)  Height: 5' 1 (1.549 m)      Body mass index is 22.3 kg/m.    Physical Exam:    Gen: Appears well, nad, nontoxic and pleasant Psych: Alert and oriented, appropriate mood and affect Neuro: Decreased sensation to left thumb, bilateral heels.  Otherwise, sensation intact, strength is 5/5 in upper and lower extremities, muscle tone wnl Skin: no susupicious lesions or rashes   Back - Normal skin, Spine with normal alignment and no deformity.     tenderness to thoracic spine vertebral process palpation.   Bilateral thoracic, L4-L5 paraspinous muscles are   tender and without spasm NTTP gluteal musculature Straight leg raise negative,  though reproduced pain in thoracic spine Trendelenberg negative Piriformis Test negative Gait normal  Pain in thoracic spine with lumbar extension    Electronically signed by:  Odis Mace D.[facility] 10:33 AM 08/05/24

## 2024-08-05 ENCOUNTER — Ambulatory Visit: Admitting: Sports Medicine

## 2024-08-05 ENCOUNTER — Encounter: Payer: Self-pay | Admitting: Sports Medicine

## 2024-08-05 VITALS — HR 94 | Ht 61.0 in | Wt 118.0 lb

## 2024-08-05 DIAGNOSIS — M546 Pain in thoracic spine: Secondary | ICD-10-CM | POA: Diagnosis not present

## 2024-08-05 DIAGNOSIS — R200 Anesthesia of skin: Secondary | ICD-10-CM

## 2024-08-05 DIAGNOSIS — G8929 Other chronic pain: Secondary | ICD-10-CM

## 2024-08-05 DIAGNOSIS — M5442 Lumbago with sciatica, left side: Secondary | ICD-10-CM | POA: Diagnosis not present

## 2024-08-05 DIAGNOSIS — R202 Paresthesia of skin: Secondary | ICD-10-CM

## 2024-08-05 DIAGNOSIS — M542 Cervicalgia: Secondary | ICD-10-CM

## 2024-08-05 DIAGNOSIS — M5441 Lumbago with sciatica, right side: Secondary | ICD-10-CM

## 2024-08-05 NOTE — Patient Instructions (Signed)
 MRI referral  Follow up 5 days after to discuss results

## 2024-08-06 ENCOUNTER — Ambulatory Visit: Payer: Self-pay

## 2024-08-06 ENCOUNTER — Other Ambulatory Visit: Payer: Self-pay | Admitting: Family

## 2024-08-06 MED ORDER — METHYLPREDNISOLONE 4 MG PO TBPK: ORAL_TABLET | ORAL | 0 refills | Status: DC

## 2024-08-06 NOTE — Telephone Encounter (Signed)
 Padonda, please see message pt saw you on 10/3 and was treated for Mastitis, still not feeling well. There are no appointments available here and Dr. Wendolyn is out of the office till Monday.

## 2024-08-06 NOTE — Telephone Encounter (Signed)
 I just talked to pt, see other message.

## 2024-08-06 NOTE — Telephone Encounter (Signed)
 FYI Only or Action Required?: Action required by provider: Requesting a call back. - see earlier encounter.  Patient was last seen in primary care on 07/31/2024 by Douglass Kenney NOVAK, FNP.  Called Nurse Triage reporting Advice Only.  Symptoms began several weeks ago.  Interventions attempted: Other: Seen at office - taking antibiotics.  Symptoms are: stable.  Triage Disposition: Call PCP Now  Patient/caregiver understands and will follow disposition?: Yes - Pt is concerned that she has not been called back. Pt has scheduled a follow up appt for tomorrow.                       Copied from CRM [id number]. Topic: Clinical - Red Word Triage >> Aug 06, 2024  2:00 PM [place] A wrote: [facility] that prompted transfer to Nurse Triage: Pain in breast from possible Masatisis Reason for Disposition  [1] Follow-up call from patient regarding patient's clinical status AND [2] information urgent  Answer Assessment - Initial Assessment Questions 1. REASON FOR CALL or QUESTION: What is your reason for calling today? or How can I best     Waiting for response from Padonda Webb 2. CALLER: Document the source of call. (e.g., laboratory staff, caregiver or patient).     Pt  Protocols used: PCP Call - No Triage-A-AH

## 2024-08-06 NOTE — Telephone Encounter (Signed)
 Patient with hx of mastitis calling with concerns for continued pain and tension to right breast. Patient was seen on 10/3 and started on antibiotics. Patient is done with her antibiotic after today. Patient is concerned with continued pain and is asking to be evaluated today. No availability in PCP today nor at alternative clinics for female providers. Patient is only wanting to see a female provider due to mastitis. Patient is asking for a call back today from office.   FYI Only or Action Required?: Action required by provider: request for appointment and clinical question for provider.  Patient was last seen in primary care on 07/31/2024 by Douglass Kenney NOVAK, FNP.  Called Nurse Triage reporting Breast Pain.  Symptoms began several days ago.  Interventions attempted: Prescription medications: Clindamycin  (last dose today) and Rest, hydration, or home remedies.  Symptoms are: unchanged.  Triage Disposition: See Physician Within 24 Hours  Patient/caregiver understands and will follow disposition?: No, wishes to speak with PCP  Copied from CRM #[id number]. Topic: Clinical - Red Word Triage >> Aug 06, 2024 10:09 AM [place] J wrote: [facility] that prompted transfer to Nurse Triage: Patient was seen on 10/03 and was prescribed antibiotics but today she woke up with pan and tension in her right breast. Reason for Disposition  [1] Breast looks infected (spreading redness, feels hot or painful to touch) AND [2] no fever  Answer Assessment - Initial Assessment Questions 1. SYMPTOM: What's the main symptom you're concerned about?  (e.g., lump, nipple discharge, pain, rash)     Pain to the nipple area and breast-pain is 6 out of 10 2. LOCATION: Where is the breast pain located?     Right breast 3. ONSET: When did breast pain  start?     Started last Friday 4. PRIOR HISTORY: Do you have any history of prior problems with your breasts? (e.g., breast cancer, breast implant, fibrocystic breast  disease)     Yes-diagnosed with mastitis  5. CAUSE: What do you think is causing this symptom?     mastitis 6. OTHER SYMPTOMS: Do you have any other symptoms? (e.g., breast pain, fever, nipple discharge, redness or rash)     Breast pain,  7. PREGNANCY-BREASTFEEDING: Is there any chance you are pregnant? When was your last menstrual period? Are you breastfeeding?     Breastfeeding  Protocols used: Breast Symptoms-A-AH

## 2024-08-06 NOTE — Telephone Encounter (Signed)
 Spoke to pt told her Carolyn Goodwin sent a Rx to the pharmacy for Prednisone  and said if does not improve may need Ultrasound. Pt verbalized understanding and asked is it okay for breastfeeding? Looked it up and told her yes it is. Asked her is she scheduled and appt? Pt said she has one tomorrow morning. Told her if she wants to hold off on starting Prednisone  till seen tomorrow that is fine. Pt verbalized understanding.

## 2024-08-07 ENCOUNTER — Encounter: Payer: Self-pay | Admitting: Family Medicine

## 2024-08-07 ENCOUNTER — Ambulatory Visit: Admitting: Family Medicine

## 2024-08-07 ENCOUNTER — Telehealth: Payer: Self-pay

## 2024-08-07 ENCOUNTER — Other Ambulatory Visit: Payer: Self-pay | Admitting: Family Medicine

## 2024-08-07 VITALS — BP 122/80 | Temp 98.0°F | Ht 61.0 in | Wt 119.2 lb

## 2024-08-07 DIAGNOSIS — N6459 Other signs and symptoms in breast: Secondary | ICD-10-CM

## 2024-08-07 DIAGNOSIS — N644 Mastodynia: Secondary | ICD-10-CM | POA: Diagnosis not present

## 2024-08-07 DIAGNOSIS — Z7189 Other specified counseling: Secondary | ICD-10-CM

## 2024-08-07 MED ORDER — CLINDAMYCIN HCL 300 MG PO CAPS: 300.0000 mg | ORAL_CAPSULE | Freq: Four times a day (QID) | ORAL | 0 refills | Status: DC

## 2024-08-07 NOTE — Progress Notes (Signed)
 Acute Office Visit  Subjective:     Patient ID: Carolyn Goodwin, female    DOB: 1971-06-27, 53 y.o.   MRN: [id number]  Chief Complaint  Patient presents with   Acute Visit    Rt breast pain, mastitis dx last week    HPI  Discussed the use of AI scribe software for clinical note transcription with the patient, who gave verbal consent to proceed.  History of Present Illness Carolyn Goodwin is a 53 year old female who presents with recurrent mastitis and breastfeeding management issues.  Mastitis and breast pain - Diagnosed with right-sided mastitis last Friday - History of left-sided mastitis with prior abscess formation, which required three weeks to resolve - Regular episodes of ductal clogging - Currently experiencing right breast soreness without clogging, blood, or fever - In December, experienced fever and erythema with mastitis; area was red and warm at initial diagnosis  Lactation and milk production - Initial low milk production attributed to C-section and age-related factors, resulting in supplementation for the infant - Currently pumps five times daily, producing over 100 mL per session - Pumping sessions last 10 to 20 minutes, depending on time of day and breast fullness - Freezer contains a surplus of expressed milk  Breastfeeding management and maternal fatigue - Infant is three months old and sleeps through the night, necessitating increased daytime pumping - Routine is exhausting and leaves little time for other activities - Occasional use of hand pump to manage ductal clogs - Concerned about ability to maintain current pumping schedule and manage fatigue upon returning to work next week     ROS Per HPI      Objective:    BP 122/80 (BP Location: Left Arm, Patient Position: Sitting)   Temp 98 F (36.7 C) (Oral)   Ht 5' 1 (1.549 m)   Wt 119 lb 3.2 oz (54.1 kg)   BMI 22.52 kg/m    Physical Exam Vitals and nursing note  reviewed.  Constitutional:      General: She is not in acute distress.    Appearance: Normal appearance. She is normal weight.  HENT:     Head: Normocephalic and atraumatic.     Right Ear: External ear normal.     Left Ear: External ear normal.     Nose: Nose normal.     Mouth/Throat:     Mouth: Mucous membranes are moist.     Pharynx: Oropharynx is clear.  Eyes:     Extraocular Movements: Extraocular movements intact.     Pupils: Pupils are equal, round, and reactive to light.  Cardiovascular:     Rate and Rhythm: Normal rate and regular rhythm.     Pulses: Normal pulses.     Heart sounds: Normal heart sounds.  Pulmonary:     Effort: Pulmonary effort is normal. No respiratory distress.     Breath sounds: Normal breath sounds. No wheezing, rhonchi or rales.  Chest:  Breasts:    Right: No swelling, bleeding, inverted nipple, mass, nipple discharge, skin change or tenderness.     Left: No swelling, bleeding, inverted nipple, mass, nipple discharge, skin change or tenderness.     Comments: Bilateral breast mildly engorged, no erythema, no heat to the area, nontender today Musculoskeletal:        General: Normal range of motion.     Cervical back: Normal range of motion.     Right lower leg: No edema.     Left lower leg: No  edema.  Lymphadenopathy:     Cervical: No cervical adenopathy.  Neurological:     General: No focal deficit present.     Mental Status: She is alert and oriented to person, place, and time.  Psychiatric:        Mood and Affect: Mood normal.        Thought Content: Thought content normal.     No results found for any visits on 08/07/24.      Assessment & Plan:   Assessment and Plan Assessment & Plan Right breast pain with history of left breast mastitis and prior abscess Recent right breast mastitis with prior left breast mastitis and abscess. Completed antibiotics. No current redness or heat, but soreness persists. - Send refill of antibiotic for  use if redness or heat develops. - Advise to monitor for signs of infection such as redness or heat. - Consider ultrasound if symptoms of infection recur.  Breast Engorgement Recurrent clogged milk ducts contributing to mastitis. Pain and lumps due to clogged ducts. Discussed management and prevention strategies. - Use hand pump to relieve clogs, especially in warm shower. - Gradually decrease pumping duration by 2-3 minutes every 24 hours. - Massage and pump in warm shower to relieve clogs.  Encounter breast-feeding counseling Breastfeeding management and oversupply Oversupply of breast milk leading to recurrent clogged ducts and mastitis. Discussed strategies to manage oversupply. - Reduce pumping frequency to every 3 hours, alternating between feeding and pumping. - Decrease pumping duration to 15 minutes consistently for 24 hours before further reduction. - Use hand pump for clogs and to manage oversupply. - Discussed use of warm showers and breast massage to help mobilize clogged milk ducts     No orders of the defined types were placed in this encounter.    No orders of the defined types were placed in this encounter.   Return if symptoms worsen or fail to improve.  Corean LITTIE Ku, FNP

## 2024-08-07 NOTE — Patient Instructions (Addendum)
 Pump OR breastfeed once every 3 hours while awake.   If you wake up in the night, pump OR feed maybe once if you need to.  I am going to refill your antibiotic in the case that you notice any redness or heat to your breasts.  Let me know Monday if you had to start the antibiotic and we will order US  of your breast.   Follow-up with me for new or worsening symptoms.

## 2024-08-07 NOTE — Telephone Encounter (Signed)
 Spoke with pt before leaving office. Verified that prescription was sent in to her pharmacy. Pt stated that she was there at the pharmacy waiting to pick up her medication. She had no further concerns.

## 2024-08-07 NOTE — Telephone Encounter (Signed)
 Pt called and was transferred to our office by E2C2 pt states she is looking for a med refill for clindamycin  (CLEOCIN ) 300 MG capsule.

## 2024-08-07 NOTE — Telephone Encounter (Signed)
 Copied from CRM [id number]. Topic: Clinical - Medication Question >> Aug 07, 2024  1:54 PM Harlene ORN wrote: Reason for CRM: Patient is waiting for the clindamycin  (CLEOCIN ) 300 MG capsule to be sent to the [facility].

## 2024-08-07 NOTE — Telephone Encounter (Signed)
 Copied from CRM [id number]. Topic: Clinical - Medication Refill >> Aug 07, 2024  3:13 PM [name] FALCON wrote: Medication: clindamycin  (CLEOCIN ) 300 MG capsule  Has the patient contacted their pharmacy? Yes (Agent: If no, request that the patient contact the pharmacy for the refill. If patient does not wish to contact the pharmacy document the reason why and proceed with request.) (Agent: If yes, when and what did the pharmacy advise?)  This is the patient's preferred pharmacy:  [facility] [id number] [place], [place] - [address] [place] [address] Phone: [phone] Fax: [phone]  Is this the correct pharmacy for this prescription? Yes If no, delete pharmacy and type the correct one.   Has the prescription been filled recently? Yes, this was done last week, and patient took all the required meds. Saw Corean Ku today (10/10) to get another refill and it has not been called in.  Is the patient out of the medication? Yes  Has the patient been seen for an appointment in the last year OR does the patient have an upcoming appointment? Yes  Can we respond through MyChart? Yes  Agent: Please be advised that Rx refills may take up to 3 business days. We ask that you follow-up with your pharmacy.

## 2024-08-07 NOTE — Addendum Note (Signed)
 Addended by: ALVIA COREAN CROME on: 08/07/2024 03:23 PM   Modules accepted: Orders

## 2024-08-07 NOTE — Telephone Encounter (Signed)
 Pt made aware through MyChart. Prescription sent to pharmacy.

## 2024-08-12 ENCOUNTER — Encounter: Payer: Self-pay | Admitting: Family Medicine

## 2024-08-12 ENCOUNTER — Telehealth: Payer: Self-pay

## 2024-08-12 NOTE — Telephone Encounter (Signed)
 Copied from CRM [id number]. Topic: General - Other >> Aug 12, 2024  1:50 PM Mercedes MATSU wrote: Reason for CRM: Patient called in requesting a return a return back to work form filled out. I offered appt patient declined and demanded her paperwork gets filled out today so she can return back to work. Patient requesting cb and can be reached at [phone] or [phone].

## 2024-08-13 NOTE — Telephone Encounter (Signed)
 Spoke with pt, she stated her Work HR told her that the forms needed to go to [employer] [facility] office, so she contacted them to have the forms completed there.

## 2024-08-18 ENCOUNTER — Encounter: Payer: Self-pay | Admitting: Physical Therapy

## 2024-08-18 ENCOUNTER — Other Ambulatory Visit: Payer: Self-pay

## 2024-08-18 ENCOUNTER — Ambulatory Visit (INDEPENDENT_AMBULATORY_CARE_PROVIDER_SITE_OTHER): Admitting: Physical Therapy

## 2024-08-18 DIAGNOSIS — M546 Pain in thoracic spine: Secondary | ICD-10-CM | POA: Diagnosis not present

## 2024-08-18 DIAGNOSIS — M5459 Other low back pain: Secondary | ICD-10-CM | POA: Diagnosis not present

## 2024-08-18 DIAGNOSIS — M6281 Muscle weakness (generalized): Secondary | ICD-10-CM

## 2024-08-18 DIAGNOSIS — M79672 Pain in left foot: Secondary | ICD-10-CM | POA: Diagnosis not present

## 2024-08-18 DIAGNOSIS — M79671 Pain in right foot: Secondary | ICD-10-CM | POA: Diagnosis not present
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
## 2024-08-20 ENCOUNTER — Ambulatory Visit
Admission: RE | Admit: 2024-08-20 | Discharge: 2024-08-20 | Disposition: A | Discharge status: Home / Self Care | Source: Ambulatory Visit | Attending: Sports Medicine | Admitting: Sports Medicine

## 2024-08-20 DIAGNOSIS — G8929 Other chronic pain: Secondary | ICD-10-CM

## 2024-08-20 DIAGNOSIS — R200 Anesthesia of skin: Secondary | ICD-10-CM

## 2024-08-20 DIAGNOSIS — M542 Cervicalgia: Secondary | ICD-10-CM

## 2024-08-21 ENCOUNTER — Ambulatory Visit: Payer: Self-pay | Admitting: Sports Medicine

## 2024-08-25 ENCOUNTER — Encounter: Payer: Self-pay | Admitting: Physical Therapy

## 2024-08-25 ENCOUNTER — Ambulatory Visit: Admitting: Physical Therapy

## 2024-08-25 ENCOUNTER — Other Ambulatory Visit: Payer: Self-pay

## 2024-08-25 DIAGNOSIS — M5459 Other low back pain: Secondary | ICD-10-CM | POA: Diagnosis not present

## 2024-08-25 DIAGNOSIS — M79672 Pain in left foot: Secondary | ICD-10-CM

## 2024-08-25 DIAGNOSIS — M79671 Pain in right foot: Secondary | ICD-10-CM | POA: Diagnosis not present

## 2024-08-25 DIAGNOSIS — M546 Pain in thoracic spine: Secondary | ICD-10-CM

## 2024-08-25 DIAGNOSIS — M6281 Muscle weakness (generalized): Secondary | ICD-10-CM
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
## 2024-08-31 NOTE — Progress Notes (Signed)
 [name] [address] [place] [address] Phone: [phone]   Assessment and Plan:     1. Neck pain (Primary) 2. Chronic bilateral thoracic back pain 3. Chronic bilateral low back pain with bilateral sciatica 4. Numbness and tingling of lower extremity 5. Numbness and tingling of left upper extremity 6. Somatic dysfunction of thoracic region 7. Somatic dysfunction of lumbar region 8. Somatic dysfunction of pelvic region -Chronic with exacerbation, subsequent visit -Continued multiple areas of musculoskeletal pain including neck, upper back, lower back, numbness and tingling into bilateral heels, numbness and tingling in the thumb that started during third-trimester of pregnancy, and have continued 4+ months postpartum - Reviewed patient's cervical spine and lumbar spine MRIs which were unremarkable.  No explanation for patient's ongoing neurologic symptoms.  Reassuring that patient's symptoms have overall improved with conservative therapy including HEP, physical therapy - Continue HEP and physical therapy - Patient is currently breast-feeding, so recommend avoiding p.o. prednisone , NSAIDs, muscle relaxers - Use Tylenol 500 to 1000 mg tablets 2-3 times a day for day-to-day pain relief  - Patient has received relief with OMT in the past.  Elects for repeat OMT today.  Tolerated well per note below. - Decision today to treat with OMT was based on Physical Exam  After verbal consent patient was treated with HVLA (high velocity low amplitude), ME (muscle energy), FPR (flex positional release), ST (soft tissue), PC/PD (Pelvic Compression/ Pelvic Decompression) techniques in   thoracic, lumbar, and pelvic areas. Patient tolerated the procedure well with improvement in symptoms.  Patient educated on potential side effects of soreness and recommended to rest, hydrate, and use Tylenol as needed for pain control.   Pertinent previous  records reviewed include lumbar and C-spine MRIs   Follow Up: 4 weeks for reevaluation.  Could consider repeat OMT.  Could discuss prednisone  versus NSAID course once patient has completed breast-feeding   Subjective:   I, Carolyn Goodwin, am serving as a neurosurgeon for Doctor Morene Mace   Chief Complaint: back pain   HPI:    07/06/2024 Patient is a 53 year old female with back pain. Patient states upper back pain started at the end of the pregnancy. Pain has calmed down a bit but she has numbness down her body. 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 Pain Is constant and she thinks nerves may be compromised. She would like to know the cause. Massages have helped. She starts PT on Friday    08/05/2024 Patient states she still has pain   09/07/2024 Patient states still has some pain. Feels like she is starting to regain feeling in her feet. Back is more difficult to tell profress    Relevant Historical Information: Postpartum with delivery in June 2025  Additional pertinent review of systems negative.   Current Outpatient Medications:    methylPREDNISolone (MEDROL DOSEPAK) 4 MG TBPK tablet, As directed (Patient not taking: Reported on 08/07/2024), Disp: 21 tablet, Rfl: 0   clindamycin  (CLEOCIN ) 300 MG capsule, Take 1 capsule (300 mg total) by mouth 4 (four) times daily., Disp: 28 capsule, Rfl: 0   ibuprofen (ADVIL) 800 MG tablet, Take 800 mg by mouth., Disp: , Rfl:    Objective:     Vitals:   09/07/24 1401  Pulse: 90  SpO2: 100%  Weight: 115 lb (52.2 kg)  Height: 5' 1 (1.549 m)      Body mass index is 21.73 kg/m.  Physical Exam:    Gen: Appears well, nad, nontoxic and pleasant Psych: Alert and oriented, appropriate mood and affect Neuro: Decreased sensation to left thumb, bilateral heels.  Otherwise, sensation intact, strength is 5/5 in upper and lower extremities, muscle tone wnl Skin: no susupicious lesions or rashes   Back - Normal skin, Spine  with normal alignment and no deformity.     tenderness to thoracic spine vertebral process palpation.   Bilateral thoracic, L4-L5 paraspinous muscles are   tender and without spasm NTTP gluteal musculature Straight leg raise negative, though reproduced pain in thoracic spine Trendelenberg negative Piriformis Test negative Gait normal  Pain in thoracic spine with lumbar extension  General: Well-appearing, cooperative, sitting comfortably in no acute distress.   OMT Physical Exam:  ASIS Compression Test: Positive Right   Thoracic: TTP paraspinal, T4-6 RRSL Lumbar: TTP paraspinal, L2 RRSR Pelvis: Right anterior innominate   Electronically signed by:  Odis Mace D.[facility] 2:39 PM 09/07/24

## 2024-09-07 ENCOUNTER — Ambulatory Visit: Admitting: Sports Medicine

## 2024-09-07 VITALS — HR 90 | Ht 61.0 in | Wt 115.0 lb

## 2024-09-07 DIAGNOSIS — G8929 Other chronic pain: Secondary | ICD-10-CM | POA: Diagnosis not present

## 2024-09-07 DIAGNOSIS — M546 Pain in thoracic spine: Secondary | ICD-10-CM

## 2024-09-07 DIAGNOSIS — M542 Cervicalgia: Secondary | ICD-10-CM | POA: Diagnosis not present

## 2024-09-07 DIAGNOSIS — M9905 Segmental and somatic dysfunction of pelvic region: Secondary | ICD-10-CM

## 2024-09-07 DIAGNOSIS — M9902 Segmental and somatic dysfunction of thoracic region: Secondary | ICD-10-CM | POA: Diagnosis not present

## 2024-09-07 DIAGNOSIS — M5442 Lumbago with sciatica, left side: Secondary | ICD-10-CM | POA: Diagnosis not present

## 2024-09-07 DIAGNOSIS — M9903 Segmental and somatic dysfunction of lumbar region: Secondary | ICD-10-CM | POA: Diagnosis not present

## 2024-09-07 DIAGNOSIS — M5441 Lumbago with sciatica, right side: Secondary | ICD-10-CM

## 2024-09-07 DIAGNOSIS — R202 Paresthesia of skin: Secondary | ICD-10-CM

## 2024-09-07 DIAGNOSIS — R200 Anesthesia of skin: Secondary | ICD-10-CM

## 2024-09-07 NOTE — Patient Instructions (Signed)
 Continue PT   Tylenol [phone] mg 2-3 times a day for pain relief   4 week follow up [facility]

## 2024-09-10 ENCOUNTER — Encounter: Payer: Self-pay | Admitting: Physical Therapy

## 2024-09-10 ENCOUNTER — Ambulatory Visit: Admitting: Physical Therapy

## 2024-09-10 ENCOUNTER — Other Ambulatory Visit: Payer: Self-pay

## 2024-09-10 DIAGNOSIS — M79672 Pain in left foot: Secondary | ICD-10-CM | POA: Diagnosis not present

## 2024-09-10 DIAGNOSIS — M79671 Pain in right foot: Secondary | ICD-10-CM

## 2024-09-10 DIAGNOSIS — M5459 Other low back pain: Secondary | ICD-10-CM | POA: Diagnosis not present

## 2024-09-10 DIAGNOSIS — M546 Pain in thoracic spine: Secondary | ICD-10-CM | POA: Diagnosis not present

## 2024-09-10 DIAGNOSIS — M6281 Muscle weakness (generalized): Secondary | ICD-10-CM
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
## 2024-09-11 ENCOUNTER — Other Ambulatory Visit: Payer: Self-pay | Admitting: Family Medicine

## 2024-09-11 ENCOUNTER — Ambulatory Visit: Admitting: Family Medicine

## 2024-09-11 ENCOUNTER — Encounter: Payer: Self-pay | Admitting: Family Medicine

## 2024-09-11 ENCOUNTER — Ambulatory Visit
Admission: RE | Admit: 2024-09-11 | Discharge: 2024-09-11 | Disposition: A | Discharge status: Home / Self Care | Source: Ambulatory Visit | Attending: Family Medicine | Admitting: Family Medicine

## 2024-09-11 VITALS — BP 136/80 | HR 86 | Temp 98.0°F | Ht 61.0 in | Wt 115.0 lb

## 2024-09-11 DIAGNOSIS — N61 Mastitis without abscess: Secondary | ICD-10-CM

## 2024-09-11 MED ORDER — SULFAMETHOXAZOLE-TRIMETHOPRIM 800-160 MG PO TABS: 1.0000 | ORAL_TABLET | Freq: Two times a day (BID) | ORAL | 0 refills | Status: DC

## 2024-09-11 NOTE — Patient Instructions (Addendum)
 Pandonda Webb, NP will be covering for me next week.  I have sent in an antibiotic for you to take 1 tablet by mouth twice a day for 10 days. Please eat with this medication, it can upset your stomach if you do not. Take all of the medication even if you are feeling better.  I have ordered a breast ultrasound for you today. Someone will be reaching out to get you scheduled.   Follow-up with me on 09/22/24

## 2024-09-11 NOTE — Progress Notes (Signed)
 Acute Office Visit  Subjective:     Patient ID: Carolyn Goodwin, female    DOB: [birth year], 53 y.o.   MRN: [id number]  Chief Complaint  Patient presents with   Breast Problem    R side breast clogging, ongoing for about 10 days off and on. Slightly painful, denies tenderness and swelling    HPI  Discussed the use of AI scribe software for clinical note transcription with the patient, who gave verbal consent to proceed.  History of Present Illness Carolyn Goodwin is a 53 year old female with a history of mastitis who presents with recurrent breast pain and suspected mastitis.  Breast pain and induration - Ongoing breast pain and soreness, primarily on the stem side - Episodes of breast tissue clogging have increased in frequency - Breast texture is granular and hard, most noticeable when in bed - No fever or significant redness present - History of mastitis that previously evolved into an abscess - No current use of clindamycin  despite having a prescription  Lactation difficulties - Decreased milk supply since the weekend, attributed to ongoing breast issues - Hand expression and use of a breast pump attempted to alleviate clogging, but these methods are time-consuming and not always effective - Infant sometimes helps with unclogging, but this is not consistently reliable  Antibiotic use and allergies - Previous mastitis showed resistance to certain antibiotics but sensitivity to Bactrim - Clindamycin  was effective for previous mastitis, but there is concern about repeated use due to potential resistance and side effects - Allergy to benzoyl peroxide     ROS Per HPI      Objective:    BP 136/80 (BP Location: Left Arm, Patient Position: Sitting)   Pulse 86   Temp 98 F (36.7 C) (Temporal)   Ht 5' 1 (1.549 m)   Wt 115 lb (52.2 kg)   SpO2 99%   Breastfeeding Yes   BMI 21.73 kg/m    Physical Exam Vitals and nursing note reviewed.   Constitutional:      General: She is not in acute distress.    Appearance: Normal appearance. She is normal weight.  HENT:     Head: Normocephalic and atraumatic.     Right Ear: External ear normal.     Left Ear: External ear normal.     Nose: Nose normal.     Mouth/Throat:     Mouth: Mucous membranes are moist.     Pharynx: Oropharynx is clear.  Eyes:     Extraocular Movements: Extraocular movements intact.     Pupils: Pupils are equal, round, and reactive to light.  Cardiovascular:     Rate and Rhythm: Normal rate.  Pulmonary:     Effort: Pulmonary effort is normal.  Chest:       Comments: Area of erythema, mild swelling, tenderness, heat.  No discharge, no lesions, no draining Musculoskeletal:        General: Normal range of motion.     Cervical back: Normal range of motion.     Right lower leg: No edema.     Left lower leg: No edema.  Lymphadenopathy:     Cervical: No cervical adenopathy.  Neurological:     General: No focal deficit present.     Mental Status: She is alert and oriented to person, place, and time.  Psychiatric:        Mood and Affect: Mood normal.        Thought Content: Thought content normal.  No results found for any visits on 09/11/24.      Assessment & Plan:   Assessment and Plan Assessment & Plan Acute right breast mastitis  with engorgement and lactation disorder Recurrent mastitis with engorgement and lactation issues. Bactrim chosen for treatment due to previous susceptibility and better tolerance. Concerns about milk supply and abscess formation addressed. - Prescribed Bactrim twice daily for 10 days. - Ordered breast ultrasound to rule out abscess formation. - Advised to take clindamycin  prescription to [place] as a backup. - Scheduled follow-up with Padonda for results review.     Orders Placed This Encounter  Procedures   US  LIMITED ULTRASOUND INCLUDING AXILLA RIGHT BREAST    [employer] PF; 06/19/24 @[facility]  EPIC ORDER     Standing Status:   Future    Expiration Date:   09/11/2025    Reason for Exam (SYMPTOM  OR DIAGNOSIS REQUIRED):   right mastitis    [facility]erred [facility]     Meds ordered this encounter  Medications   sulfamethoxazole-trimethoprim (BACTRIM DS) 800-160 MG tablet    Sig: Take 1 tablet by mouth 2 (two) times daily for 10 days.    Dispense:  20 tablet    Refill:  0    Return for 09/22/24.  Corean LITTIE Ku, FNP

## 2024-09-14 ENCOUNTER — Ambulatory Visit: Payer: Self-pay | Admitting: Family Medicine

## 2024-09-15 ENCOUNTER — Encounter: Payer: Self-pay | Admitting: Family Medicine

## 2024-09-15 ENCOUNTER — Other Ambulatory Visit: Payer: Self-pay

## 2024-09-15 ENCOUNTER — Ambulatory Visit: Admitting: Family Medicine

## 2024-09-15 ENCOUNTER — Ambulatory Visit: Admitting: Physical Therapy

## 2024-09-15 ENCOUNTER — Encounter: Payer: Self-pay | Admitting: Physical Therapy

## 2024-09-15 ENCOUNTER — Ambulatory Visit: Payer: Self-pay

## 2024-09-15 VITALS — BP 146/84 | HR 98 | Temp 98.1°F | Resp 16 | Ht 61.0 in | Wt 115.8 lb

## 2024-09-15 DIAGNOSIS — M79672 Pain in left foot: Secondary | ICD-10-CM | POA: Diagnosis not present

## 2024-09-15 DIAGNOSIS — R112 Nausea with vomiting, unspecified: Secondary | ICD-10-CM | POA: Diagnosis not present

## 2024-09-15 DIAGNOSIS — M79671 Pain in right foot: Secondary | ICD-10-CM | POA: Diagnosis not present

## 2024-09-15 DIAGNOSIS — N61 Mastitis without abscess: Secondary | ICD-10-CM

## 2024-09-15 DIAGNOSIS — M6281 Muscle weakness (generalized): Secondary | ICD-10-CM

## 2024-09-15 DIAGNOSIS — Z9189 Other specified personal risk factors, not elsewhere classified: Secondary | ICD-10-CM | POA: Diagnosis not present

## 2024-09-15 DIAGNOSIS — M5459 Other low back pain: Secondary | ICD-10-CM

## 2024-09-15 DIAGNOSIS — M546 Pain in thoracic spine: Secondary | ICD-10-CM | POA: Diagnosis not present

## 2024-09-15 MED ORDER — CLINDAMYCIN HCL 300 MG PO CAPS: 300.0000 mg | ORAL_CAPSULE | Freq: Three times a day (TID) | ORAL | 0 refills | Status: AC

## 2024-09-15 NOTE — Patient Instructions (Signed)
 Access Code: [email or web address] URL: [email or web address] Date: 09/15/2024 Prepared by: Elaine Daring  Exercises - Supine Pelvic Tilt  - 1 x daily - 10 reps - 5 seconds hold - Supine Cervical Retraction with Towel  - 1 x daily - 10 reps - 5 seconds hold - Cat Cow  - 1 x daily - 10 reps - Seated Thoracic Flexion and Rotation with Arms Crossed  - 2-3 x daily - 10 reps - Seated Piriformis Stretch with Trunk Bend  - 1 x daily - 3 reps - 20 seconds hold - Supine Sciatic Nerve Glide  - 1 x daily - 3 sets - 10 reps - Kneeling Thoracic Extension Stretch with Swiss Ball  - 1 x daily - Standing Gastroc Stretch at [employer]  - 1 x daily
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
## 2024-09-15 NOTE — Progress Notes (Signed)
 ACUTE VISIT Chief Complaint  Patient presents with   Breast Problem    Mastitis right breast. Has been on and off for a few weeks. Saturday seemed better but Sunday it got worst, on antibiotics and doesn't think they are working.    Discussed the use of AI scribe software for clinical note transcription with the patient, who gave verbal consent to proceed.  History of Present Illness Carolyn Goodwin is a 53 year old female who presents with persistent right breast pain and erythema. She has been seen for same problem on 07/31/24, 08/07/24, and 09/11/24. Mammogram 09/11/24. 1. No mammographic or sonographic evidence of malignancy at the sites of painful or palpable concern in the RIGHT breast. Specifically, no focal drainable fluid collection or abscess is noted. Any further workup of the patient's symptoms should be based on the clinical assessment. 2. No mammographic evidence of malignancy in the RIGHT breast.  She has been experiencing right breast pain since July 31, 2024, initially diagnosed as mastitis.  She has taken abx treatments since problem onset, which usually provides temporal relief. Currently she is on Bactrim DS, started on 09/11/24 but she does not believe medication is helping.  The pain severity fluctuates between 6 and 8 out of 10.  She has difficulty pumping milk from the affected breast, states that breast the milk appears normal without purulent drainage.  She feels unwell with symptoms of nausea, chills, and a subjective fever, although her temperature was recorded at 98.80F. She experienced an episode of vomiting last week and has had some epigastric abdominal discomfort. Negative for changes in bowel habits or melena.  She has a history of similar issues with the left breast over the summer, which resolved with clindamycin .   She is having some difficulties with breastfeeding. States that initially she was told she was not going to be able to  breastfeed, so her son was started on formula. He does not want to latch and gets irritable; so she is pumping. Breastfeeding sometimes helps with breast discomfort.  Blood pressure mildly elevated, denies hx of HTN and attributes it to being in pain.She has hx of gestational HTN.  Review of Systems  Constitutional:  Negative for activity change and appetite change.  HENT:  Negative for sore throat.   Respiratory:  Negative for cough and shortness of breath.   Cardiovascular:  Negative for chest pain and leg swelling.  Genitourinary:  Negative for decreased urine volume, dysuria and hematuria.  Skin:  Negative for rash.  Neurological:  Negative for syncope and weakness.  See other pertinent positives and negatives in HPI.  Current Outpatient Medications on File Prior to Visit  Medication Sig Dispense Refill   ibuprofen (ADVIL) 800 MG tablet Take 800 mg by mouth.     No current facility-administered medications on file prior to visit.    Past Medical History:  Diagnosis Date   Allergy    Allergies  Allergen Reactions   Benzoyl Peroxide Rash and Shortness Of Breath   Other Hives, Rash and Dermatitis    Social History   Socioeconomic History   Marital status: Married    Spouse name: Not on file   Number of children: 1   Years of education: Not on file   Highest education level: Not on file  Occupational History   Occupation: professor    Comment: [employer]  Tobacco Use   Smoking status: Never   Smokeless tobacco: Never  Vaping Use   Vaping status:  Never Used  Substance and Sexual Activity   Alcohol use: Not Currently   Drug use: Never   Sexual activity: Yes    Birth control/protection: None  Other Topics Concern   Not on file  Social History Narrative   Step-4,   son   Social Drivers of Corporate Investment Banker Strain: Low Risk  (05/06/2024)   Received from [facility]   Overall Financial Resource Strain (CARDIA)    Difficulty of Paying Living  Expenses: Not hard at all  Food Insecurity: No Food Insecurity (04/21/2024)   [facility]   Hunger Vital Sign    Within the past 12 months, you worried that your food would run out before you got the money to buy more.: Never true    Within the past 12 months, the food you bought just didn't last and you didn't have money to get more.: Never true  Transportation Needs: No Transportation Needs (04/21/2024)   Received from [facility] - Transportation    Lack of Transportation (Medical): No    Lack of Transportation (Non-Medical): No  Physical Activity: Not on file  Stress: No Stress Concern Present (05/06/2024)   Received from [facility] - Occupational Stress Questionnaire    Feeling of Stress : Not at all  Social Connections: Unknown (05/06/2024)   Received from [facility]   Social Connection and Isolation Panel    In a typical week, how many times do you talk on the phone with family, friends, or neighbors?: Three times a week    How often do you get together with friends or relatives?: Once a week    Attends Religious Services: Not on file    Active Member of Clubs or Organizations: Not on file    Attends Banker Meetings: Not on file    Marital Status: Not on file    Vitals:   09/15/24 1306  BP: (!) 146/84  Pulse: 98  Resp: 16  Temp: 98.1 F (36.7 C)  SpO2: 96%   Body mass index is 21.88 kg/m.  Physical Exam Vitals and nursing note reviewed.  Constitutional:      General: She is not in acute distress.    Appearance: She is well-developed.  HENT:     Head: Normocephalic and atraumatic.  Eyes:     Conjunctiva/sclera: Conjunctivae normal.  Pulmonary:     Effort: Pulmonary effort is normal. No respiratory distress.     Breath sounds: Normal breath sounds.  Chest:  Breasts:    Right: Tenderness present. No swelling or nipple discharge.     Left: No swelling or nipple discharge.      Comments: Right breast with mild tenderness. Mild erythema around inner outer quadrant , no induration or fluctuant area. Small papular whitish lesion,1-2 mm, on tip of nipple. No masses but fullness specially inner upper quadrant,  Abdominal:     Palpations: Abdomen is soft. There is no mass.     Tenderness: There is no abdominal tenderness.  Lymphadenopathy:     Cervical: No cervical adenopathy.     Upper Body:     Right upper body: No supraclavicular or axillary adenopathy.     Left upper body: No supraclavicular or axillary adenopathy.  Skin:    General: Skin is warm.     Findings: No rash.  Neurological:     Mental Status: She is alert and oriented to person, place, and time.  Psychiatric:        Mood and Affect: Affect normal. Mood is anxious.    ASSESSMENT AND PLAN: Ms Calico was seen today for recurrent right beast pain and erythema.  Mastitis without abscess We discussed differential Dx. She has been problem recurrently since 07/31/24. She is currently on Bactrim DS, started 09/11/24. I do not think it is an infectious process but rather a problem with lactation. She would like to change to Clindamycin  , which has helped with similar problem in left breast. We discussed possible complications of abx treatment, including C diff infection.  Stop Bactrim. Clindamycin  300 mg tid x 5 days. Start a daily probiotic. Avoid pumping instead try to milk breast manually. Cold compresses may help. Clearly instructed about warning signs. She has an appt with her gynecologist earlier next week. May need surgical consultation.  -     Clindamycin  HCl; Take 1 capsule (300 mg total) by mouth 3 (three) times daily for 5 days.  Dispense: 15 capsule; Refill: 0  Breastfeeding problem She has had counseling. We try here in the office , it was frustrating for her, son was irritable, so we stopped. Encouraged to continue working on technique.   Nausea and vomiting in adult ?  Dyspepsia. Frequent abx use may be a contributing factor. I do not think further work up is needed at this time. Instructed about warning signs.  In regard to elevated BP, recommend monitoring BP at home.  Return if symptoms worsen or fail to improve, for keep next appointment.  Gavin Faivre G. Corbin Falck, MD  [facility]. [place] office.

## 2024-09-15 NOTE — Telephone Encounter (Signed)
 FYI Only or Action Required?: FYI only for provider: appointment scheduled on 09/15/24 at [facility].  Patient was last seen in primary care on 09/11/2024 by Alvia Corean CROME, FNP.  Called Nurse Triage reporting Breast Problem.  Symptoms began several months ago.  Interventions attempted: OTC medications: Tylenol, ibuprofen, Prescription medications: Bactrim, and Ice/heat application.  Symptoms are: right breast 6 lumps, warmth, severe pain, redness gradually worsening since Sunday.  Triage Disposition: See Physician Within 4 Hours (or PCP Triage) (overriding See Physician Within 24 Hours)  Patient/caregiver understands and will follow disposition?: Yes           Copied from CRM #[id number]. Topic: Clinical - Red Word Triage >> Sep 15, 2024  8:32 AM Suzen RAMAN wrote: Red Word that prompted transfer to Nurse Triage: nausea, breast is red, more lumps found and currently experiencing pain and medication prescribed at last appt (09/11/24) not helping Reason for Disposition  [1] Breast looks infected (spreading redness, feels hot or painful to touch) AND [2] no fever  Answer Assessment - Initial Assessment Questions 1. SYMPTOM: What's the main symptom you're concerned about?  (e.g., lump, nipple discharge, pain, rash)     Redness, pain 8/10, warmth, lumps increased (states it feels like a large granular mass states it feels like 5 on the top and 1 on the side); nausea  2. LOCATION: Where is the symptoms located?     Right breast.  3. ONSET: When did symptoms  start?     1.5-2 months. Patient was seen 09/11/24 for symptoms. Patient states she felt better on Saturday but worsened Sunday.  4. PRIOR HISTORY: Do you have any history of prior problems with your breasts? (e.g., breast cancer, breast implant, fibrocystic breast disease)     No.  5. CAUSE: What do you think is causing this symptom?     Mastitis.  6. OTHER SYMPTOMS: Do you have any other symptoms?  (e.g., breast pain, fever, nipple discharge, redness or rash)     Nausea. Unsure if she has fever.  7. PREGNANCY-BREASTFEEDING: Is there any chance you are pregnant? When was your last menstrual period? Are you breastfeeding?     Currently breastfeeding.  Patient has been taking Bactrim twice daily. Patient declined OV to see PCP tomorrow and states she would prefer to be seen today and at [facility] if possible. No appointments available today at [facility] and patient declined female provider that is available at PCP office. Patient scheduled with female provider at [facility] today.  Protocols used: Breast Symptoms-A-AH

## 2024-09-15 NOTE — Telephone Encounter (Signed)
 Pt called stating that s/s are worsening and requesting an earlier appt.  - Called [facility] - no earlier appts available. Called [facility] - no appts available today   Pt will call  EMS if needed.         Source  Carolyn Goodwin, Carolyn Goodwin (Patient)   Subject  Goodwin, Carolyn Goodwin (Patient)   Topic  Clinical - Red Word Triage    Communication  Red Word that prompted transfer to Nurse Triage: nausea, breast is red, more lumps found and currently experiencing pain and medication prescribed at last appt (09/11/24) not helping

## 2024-09-15 NOTE — Patient Instructions (Addendum)
 A few things to remember from today's visit:  Breastfeeding problem  Mastitis without abscess I do nit think there is an infectious problem at this time. Avid pumping, instead try to empty breast manually. Continue trying to breast feed. Cold compressed may help. If fever please seek immediate medical attention. Keep appt with gynecologist.  Do not use My Chart to request refills or for acute issues that need immediate attention. If you send a my chart message, it may take a few days to be addressed, specially if I am not in the office.  Please be sure medication list is accurate. If a new problem present, please set up appointment sooner than planned today.

## 2024-09-16 ENCOUNTER — Encounter: Payer: Self-pay | Admitting: Physical Therapy

## 2024-09-22 ENCOUNTER — Other Ambulatory Visit: Payer: Self-pay

## 2024-09-22 ENCOUNTER — Ambulatory Visit: Admitting: Family Medicine

## 2024-09-22 ENCOUNTER — Ambulatory Visit: Admitting: Physical Therapy

## 2024-09-22 ENCOUNTER — Encounter: Payer: Self-pay | Admitting: Physical Therapy

## 2024-09-22 VITALS — BP 126/72 | HR 100 | Ht 61.0 in | Wt 116.8 lb

## 2024-09-22 DIAGNOSIS — M5459 Other low back pain: Secondary | ICD-10-CM | POA: Diagnosis not present

## 2024-09-22 DIAGNOSIS — M546 Pain in thoracic spine: Secondary | ICD-10-CM | POA: Diagnosis not present

## 2024-09-22 DIAGNOSIS — O9122 Nonpurulent mastitis associated with the puerperium: Secondary | ICD-10-CM | POA: Diagnosis not present

## 2024-09-22 DIAGNOSIS — M79671 Pain in right foot: Secondary | ICD-10-CM

## 2024-09-22 DIAGNOSIS — O9270 Unspecified disorders of lactation: Secondary | ICD-10-CM

## 2024-09-22 DIAGNOSIS — M79672 Pain in left foot: Secondary | ICD-10-CM | POA: Diagnosis not present

## 2024-09-22 DIAGNOSIS — M6281 Muscle weakness (generalized): Secondary | ICD-10-CM

## 2024-09-22 DIAGNOSIS — N6011 Diffuse cystic mastopathy of right breast: Secondary | ICD-10-CM

## 2024-09-22 NOTE — Progress Notes (Signed)
 Acute Office Visit  Subjective:     Patient ID: Carolyn Goodwin, female    DOB: 1971/01/31, 53 y.o.   MRN: [id number]  Chief Complaint  Patient presents with   Follow-up    Fatigue My R breast keeps clogging and unclogging     HPI  Discussed the use of AI scribe software for clinical note transcription with the patient, who gave verbal consent to proceed.  History of Present Illness Carolyn Goodwin is a 53 year old female who presents with recurrent breast clogging and concerns about breastfeeding.  Breast clogging and tenderness - Recurrent clogging of the right breast with intermittent tenderness - Expressing milk effectively manages clogging episodes - Pumping three to four times daily does not alleviate clogging - Completed a five-day course of clindamycin , taken three times daily, with resolution of clogging within one day but persistent tenderness - Concern about potential antibiotic resistance - Monitors for signs of infection  Breastfeeding difficulties - Infant son refuses to nurse on the right breast, attributed to smaller and different nipple - Infant prefers nursing on the left side, especially when tired - Breastfeeds five times daily and pumps three to four times daily to maintain milk production and provide bottles - Concern about potential for clogging on the right breast due to infant's nursing preference     ROS Per HPI      Objective:    BP 126/72 (BP Location: Right Arm, Patient Position: Sitting, Cuff Size: Normal)   Pulse 100   Ht 5' 1 (1.549 m)   Wt 116 lb 12.8 oz (53 kg)   SpO2 97%   Breastfeeding Yes   BMI 22.07 kg/m    Physical Exam Vitals and nursing note reviewed.  Constitutional:      General: She is not in acute distress.    Appearance: Normal appearance. She is normal weight.  HENT:     Head: Normocephalic and atraumatic.     Right Ear: External ear normal.     Left Ear: External ear normal.      Nose: Nose normal.     Mouth/Throat:     Mouth: Mucous membranes are moist.     Pharynx: Oropharynx is clear.  Eyes:     Extraocular Movements: Extraocular movements intact.     Pupils: Pupils are equal, round, and reactive to light.  Cardiovascular:     Rate and Rhythm: Normal rate and regular rhythm.     Pulses: Normal pulses.     Heart sounds: Normal heart sounds.  Pulmonary:     Effort: Pulmonary effort is normal. No respiratory distress.     Breath sounds: Normal breath sounds. No wheezing, rhonchi or rales.  Chest:     Comments: Previous area of erythema and tenderness to the lateral aspect of the right breast has resolved Musculoskeletal:        General: Normal range of motion.     Cervical back: Normal range of motion.     Right lower leg: No edema.     Left lower leg: No edema.  Lymphadenopathy:     Cervical: No cervical adenopathy.  Neurological:     General: No focal deficit present.     Mental Status: She is alert and oriented to person, place, and time.  Psychiatric:        Mood and Affect: Mood normal.        Thought Content: Thought content normal.     No results found for  any visits on 09/22/24.      Assessment & Plan:   Assessment and Plan Assessment & Plan Chronic Right breast mastitis with lactation problem Intermittent tenderness and clogging likely due to breastfeeding challenges. No active infection; small red spot improved. Concerns about clindamycin  resistance. - Continue breastfeeding on demand. - Reduce pumping to three times daily: morning, early evening, before bed. - Limit pumping to 10-20 minutes to prevent overproduction and clogging. - Consider nipple shield on right side. - Monitor for infection signs; contact OB/GYN if needed. - Reserve clindamycin  for potential future use.     No orders of the defined types were placed in this encounter.    No orders of the defined types were placed in this encounter.   Return if symptoms  worsen or fail to improve.  Corean LITTIE Ku, FNP
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
## 2024-09-28 ENCOUNTER — Encounter: Payer: Self-pay | Admitting: Family Medicine

## 2024-09-28 NOTE — Patient Instructions (Signed)
 Take the prescription of clindamycin  with you when you go to [place] in the case of mastitis while you are there.   Follow-up with me for new or worsening symptoms.

## 2024-09-29 ENCOUNTER — Other Ambulatory Visit: Payer: Self-pay

## 2024-09-29 ENCOUNTER — Encounter: Payer: Self-pay | Admitting: Physical Therapy

## 2024-09-29 ENCOUNTER — Ambulatory Visit: Admitting: Physical Therapy

## 2024-09-29 DIAGNOSIS — M79672 Pain in left foot: Secondary | ICD-10-CM

## 2024-09-29 DIAGNOSIS — M5459 Other low back pain: Secondary | ICD-10-CM

## 2024-09-29 DIAGNOSIS — M546 Pain in thoracic spine: Secondary | ICD-10-CM

## 2024-09-29 DIAGNOSIS — M79671 Pain in right foot: Secondary | ICD-10-CM

## 2024-09-29 DIAGNOSIS — M6281 Muscle weakness (generalized): Secondary | ICD-10-CM

## 2024-09-29 NOTE — Patient Instructions (Signed)
 Access Code: [email or web address] URL: [email or web address] Date: 09/29/2024 Prepared by: Elaine Daring  Exercises - Supine Pelvic Tilt  - 1 x daily - 10 reps - 5 seconds hold - Supine Cervical Retraction with Towel  - 1 x daily - 10 reps - 5 seconds hold - Cat Cow  - 1 x daily - 10 reps - Seated Thoracic Flexion and Rotation with Arms Crossed  - 2-3 x daily - 10 reps - Seated Piriformis Stretch with Trunk Bend  - 1 x daily - 3 reps - 20 seconds hold - Supine Sciatic Nerve Glide  - 1 x daily - 3 sets - 10 reps - Kneeling Thoracic Extension Stretch with Swiss Ball  - 1 x daily - Standing Gastroc Stretch at [employer]  - 1 x daily - Prone Scapular Retraction  - 10 reps - 5 seconds hold
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
## 2024-10-02 NOTE — Progress Notes (Unsigned)
Carolyn Goodwin D.Carolyn Goodwin Carolyn Goodwin [address] [place] [address] Phone: [phone]   Assessment and Plan:     1. Neck pain (Primary) 2. Chronic bilateral thoracic back pain 3. Chronic bilateral low back pain with bilateral sciatica 4. Numbness and tingling of lower extremity 5. Numbness and tingling of left upper extremity - Chronic with exacerbation, subsequent visit -Continued multiple areas of musculoskeletal pain including neck, upper back, lower back, numbness and tingling into bilateral heels, numbness and tingling in the thumb that started during third-trimester of pregnancy, and have continued 5+ months postpartum. No explanation for patient's ongoing neurologic symptoms.  -   patient's cervical spine and lumbar spine MRIs which were unremarkable.    Recommend further evaluation with thoracic MRI based on continued upper back pain that has not significantly improved despite >6 weeks of conservative therapy, x-ray imaging, pain with day-to-day activities, pain >6/10 - Continue HEP and physical therapy - Patient is currently breast-feeding, so recommend avoiding p.o. prednisone , NSAIDs, muscle relaxers - Use Tylenol 500 to 1000 mg tablets 2-3 times a day for day-to-day pain relief    Pertinent previous records reviewed include physical therapy notes   Follow Up: 1 week after MRI to review results and discuss treatment plan.  Could discuss EMG, however patient is not experiencing weakness.  I do feel that patient would benefit from anti-inflammatory medication course once completing breast-feeding.  Could consider repeat OMT versus referral for second opinion   Subjective:   I, Carolyn Goodwin, am serving as a neurosurgeon for Doctor Carolyn Goodwin   Chief Complaint: back pain   HPI:    07/06/2024 Patient is a 53 year old female with back pain. Patient states upper back pain started at the end of the pregnancy. Pain has calmed down a bit but she  has numbness down her body. 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 Pain Is constant and she thinks nerves may be compromised. She would like to know the cause. Massages have helped. She starts PT on Friday    08/05/2024 Patient states she still has pain    09/07/2024 Patient states still has some pain. Feels like she is starting to regain feeling in her feet. Back is more difficult to tell profress   10/05/2024 Patient states her back is in pain.    Relevant Historical Information: Postpartum with delivery in June 2025  Additional pertinent review of systems negative.   Current Outpatient Medications:    ibuprofen (ADVIL) 800 MG tablet, Take 800 mg by mouth., Disp: , Rfl:    Objective:     Vitals:   10/05/24 1559  Pulse: 87  SpO2: 99%  Weight: 116 lb (52.6 kg)  Height: 5' 1 (1.549 m)      Body mass index is 21.92 kg/m.    Physical Exam:    Gen: Appears well, nad, nontoxic and pleasant Psych: Alert and oriented, appropriate mood and affect Neuro: Decreased sensation to left thumb, bilateral heels.  Otherwise, sensation intact, strength is 5/5 in upper and lower extremities, muscle tone wnl Skin: no susupicious lesions or rashes   Back - Normal skin, Spine with normal alignment and no deformity.   Significant tenderness to thoracic spine vertebral process palpation.   Bilateral thoracic, L4-L5 paraspinous muscles are   tender and without spasm NTTP gluteal musculature Straight leg raise negative, though reproduced pain in thoracic spine Trendelenberg negative Piriformis Test negative Gait normal  Pain in thoracic  spine with lumbar extension   Electronically signed by:  Odis Goodwin D.Carolyn Goodwin Carolyn Goodwin 4:13 PM 10/05/24

## 2024-10-05 ENCOUNTER — Other Ambulatory Visit: Payer: Self-pay

## 2024-10-05 ENCOUNTER — Encounter: Payer: Self-pay | Admitting: Physical Therapy

## 2024-10-05 ENCOUNTER — Ambulatory Visit: Admitting: Sports Medicine

## 2024-10-05 ENCOUNTER — Ambulatory Visit: Admitting: Physical Therapy

## 2024-10-05 VITALS — HR 87 | Ht 61.0 in | Wt 116.0 lb

## 2024-10-05 DIAGNOSIS — M6281 Muscle weakness (generalized): Secondary | ICD-10-CM

## 2024-10-05 DIAGNOSIS — M79671 Pain in right foot: Secondary | ICD-10-CM

## 2024-10-05 DIAGNOSIS — M5441 Lumbago with sciatica, right side: Secondary | ICD-10-CM

## 2024-10-05 DIAGNOSIS — R202 Paresthesia of skin: Secondary | ICD-10-CM

## 2024-10-05 DIAGNOSIS — M5442 Lumbago with sciatica, left side: Secondary | ICD-10-CM

## 2024-10-05 DIAGNOSIS — M5459 Other low back pain: Secondary | ICD-10-CM

## 2024-10-05 DIAGNOSIS — M542 Cervicalgia: Secondary | ICD-10-CM

## 2024-10-05 DIAGNOSIS — M546 Pain in thoracic spine: Secondary | ICD-10-CM

## 2024-10-05 DIAGNOSIS — G8929 Other chronic pain: Secondary | ICD-10-CM

## 2024-10-05 DIAGNOSIS — R200 Anesthesia of skin: Secondary | ICD-10-CM

## 2024-10-05 DIAGNOSIS — M79672 Pain in left foot: Secondary | ICD-10-CM
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
## 2024-10-05 NOTE — Patient Instructions (Addendum)
 Tylenol [phone] mg 2-3 times a day for pain relief   Thoracic MRI   Continue heating pad, HEP, and PT   Follow up 1 week after to discuss results

## 2024-10-06 ENCOUNTER — Encounter: Payer: Self-pay | Admitting: Physical Therapy

## 2024-11-06 ENCOUNTER — Encounter: Admitting: Family Medicine

## 2024-11-07 ENCOUNTER — Other Ambulatory Visit

## 2024-11-10 ENCOUNTER — Encounter: Admitting: Physical Therapy

## 2024-11-12 ENCOUNTER — Other Ambulatory Visit

## 2024-11-13 ENCOUNTER — Telehealth: Payer: Self-pay

## 2024-11-13 NOTE — Telephone Encounter (Signed)
 Copied from CRM [id number]. Topic: Appointments - Scheduling Inquiry for Clinic >> Nov 13, 2024  3:02 PM Carolyn Goodwin wrote: Reason for CRM: Patient called to reschedule her physical, offered first available in April. Patient states that she thought Dr Wendolyn would like to see her sooner. Would like to confirm with the Dr when she should be seen prior to her scheduling.   Patient can be reached at [phone]

## 2024-11-16 ENCOUNTER — Encounter: Admitting: Family Medicine

## 2024-11-17 ENCOUNTER — Encounter: Admitting: Physical Therapy

## 2024-11-18 ENCOUNTER — Ambulatory Visit: Admitting: Physical Therapy

## 2024-11-18 ENCOUNTER — Other Ambulatory Visit: Payer: Self-pay

## 2024-11-18 ENCOUNTER — Encounter: Payer: Self-pay | Admitting: Physical Therapy

## 2024-11-18 ENCOUNTER — Ambulatory Visit: Admitting: Sports Medicine

## 2024-11-18 DIAGNOSIS — M546 Pain in thoracic spine: Secondary | ICD-10-CM

## 2024-11-18 DIAGNOSIS — M79671 Pain in right foot: Secondary | ICD-10-CM

## 2024-11-18 DIAGNOSIS — M5459 Other low back pain: Secondary | ICD-10-CM

## 2024-11-18 DIAGNOSIS — M6281 Muscle weakness (generalized): Secondary | ICD-10-CM

## 2024-11-18 DIAGNOSIS — M79672 Pain in left foot: Secondary | ICD-10-CM | POA: Diagnosis not present
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
## 2024-11-18 NOTE — Patient Instructions (Signed)
 Access Code: [email or web address] URL: [email or web address] Date: 11/18/2024 Prepared by: Elaine Daring  Exercises - Supine Pelvic Tilt  - 1 x daily - 10 reps - 5 seconds hold - Supine Cervical Retraction with Towel  - 1 x daily - 10 reps - 5 seconds hold - Seated Thoracic Flexion and Rotation with Arms Crossed  - 2-3 x daily - 10 reps - Seated Piriformis Stretch with Trunk Bend  - 1 x daily - 3 reps - 20 seconds hold - Supine Sciatic Nerve Glide  - 1 x daily - 3 sets - 10 reps - Kneeling Thoracic Extension Stretch with Swiss Ball  - 1 x daily - Cat Cow  - 1 x daily - Child's Pose Thoracic Rotation with Hand on Neck  - 1 x daily - Sidelying Thoracic Lumbar Rotation  - 1 x daily - Standing Gastroc Stretch at Counter  - 1 x daily - Prone Scapular Retraction  - 10 reps - 5 seconds hold

## 2024-11-19 ENCOUNTER — Ambulatory Visit: Admitting: Sports Medicine

## 2024-11-24 ENCOUNTER — Encounter: Admitting: Physical Therapy

## 2024-11-25 ENCOUNTER — Ambulatory Visit: Admitting: Physical Therapy

## 2024-11-25 ENCOUNTER — Encounter: Payer: Self-pay | Admitting: Physical Therapy

## 2024-11-25 ENCOUNTER — Other Ambulatory Visit: Payer: Self-pay

## 2024-11-25 DIAGNOSIS — M5459 Other low back pain: Secondary | ICD-10-CM | POA: Diagnosis not present

## 2024-11-25 DIAGNOSIS — M79672 Pain in left foot: Secondary | ICD-10-CM | POA: Diagnosis not present

## 2024-11-25 DIAGNOSIS — M546 Pain in thoracic spine: Secondary | ICD-10-CM

## 2024-11-25 DIAGNOSIS — M6281 Muscle weakness (generalized): Secondary | ICD-10-CM | POA: Diagnosis not present

## 2024-11-25 DIAGNOSIS — M79671 Pain in right foot: Secondary | ICD-10-CM
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
## 2024-12-02 ENCOUNTER — Other Ambulatory Visit: Payer: Self-pay

## 2024-12-02 ENCOUNTER — Ambulatory Visit: Admitting: Physical Therapy

## 2024-12-02 ENCOUNTER — Encounter: Payer: Self-pay | Admitting: Physical Therapy

## 2024-12-02 DIAGNOSIS — M79671 Pain in right foot: Secondary | ICD-10-CM | POA: Diagnosis not present

## 2024-12-02 DIAGNOSIS — M79672 Pain in left foot: Secondary | ICD-10-CM | POA: Diagnosis not present

## 2024-12-02 DIAGNOSIS — M5459 Other low back pain: Secondary | ICD-10-CM | POA: Diagnosis not present

## 2024-12-02 DIAGNOSIS — M546 Pain in thoracic spine: Secondary | ICD-10-CM | POA: Diagnosis not present

## 2024-12-02 DIAGNOSIS — M6281 Muscle weakness (generalized): Secondary | ICD-10-CM | POA: Diagnosis not present
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
## 2024-12-09 ENCOUNTER — Encounter: Admitting: Physical Therapy

## 2024-12-11 ENCOUNTER — Encounter: Admitting: Family Medicine

## 2024-12-16 ENCOUNTER — Encounter: Admitting: Physical Therapy
